# Patient Record
Sex: Male | Born: 1980 | Race: White | Hispanic: No | Smoking: Current every day smoker
Health system: Southern US, Community
[De-identification: ages and names within clinical notes are randomized; demographics above are authoritative.]

## PROBLEM LIST (undated history)

## (undated) DIAGNOSIS — F1910 Other psychoactive substance abuse, uncomplicated: Secondary | ICD-10-CM

## (undated) DIAGNOSIS — J9383 Other pneumothorax: Secondary | ICD-10-CM

## (undated) HISTORY — PX: CHEST TUBE INSERTION: SHX231

---

## 2006-07-29 ENCOUNTER — Emergency Department: Payer: Self-pay | Admitting: Emergency Medicine

## 2008-02-10 ENCOUNTER — Inpatient Hospital Stay: Payer: Self-pay | Admitting: Vascular Surgery

## 2010-01-14 ENCOUNTER — Emergency Department: Payer: Self-pay | Admitting: Unknown Physician Specialty

## 2014-11-27 ENCOUNTER — Emergency Department
Admission: EM | Admit: 2014-11-27 | Discharge: 2014-11-27 | Disposition: A | Discharge status: Home / Self Care | Payer: Self-pay | Attending: Emergency Medicine | Admitting: Emergency Medicine

## 2014-11-27 ENCOUNTER — Encounter: Payer: Self-pay | Admitting: *Deleted

## 2014-11-27 DIAGNOSIS — L02214 Cutaneous abscess of groin: Secondary | ICD-10-CM | POA: Insufficient documentation

## 2014-11-27 DIAGNOSIS — Z720 Tobacco use: Secondary | ICD-10-CM | POA: Insufficient documentation

## 2014-11-27 DIAGNOSIS — L0390 Cellulitis, unspecified: Secondary | ICD-10-CM | POA: Insufficient documentation

## 2014-11-27 MED ORDER — HYDROMORPHONE HCL 1 MG/ML IJ SOLN
1.0000 mg | Freq: Once | INTRAMUSCULAR | Status: AC
  Administered 2014-11-27: 1 mg via INTRAMUSCULAR

## 2014-11-27 MED ORDER — LIDOCAINE-EPINEPHRINE (PF) 1 %-1:200000 IJ SOLN
INTRAMUSCULAR | Status: AC
  Administered 2014-11-27: 30 mL
  Filled 2014-11-27: qty 30

## 2014-11-27 MED ORDER — SULFAMETHOXAZOLE-TRIMETHOPRIM 800-160 MG PO TABS: 1.0000 | ORAL_TABLET | Freq: Two times a day (BID) | ORAL | Status: DC

## 2014-11-27 MED ORDER — HYDROMORPHONE HCL 1 MG/ML IJ SOLN
INTRAMUSCULAR | Status: AC
  Administered 2014-11-27: 1 mg via INTRAMUSCULAR
  Filled 2014-11-27: qty 1

## 2014-11-27 MED ORDER — OXYCODONE-ACETAMINOPHEN 5-325 MG PO TABS: 1.0000 | ORAL_TABLET | Freq: Four times a day (QID) | ORAL | Status: DC | PRN

## 2014-11-27 MED ORDER — LIDOCAINE-EPINEPHRINE 2 %-1:100000 IJ SOLN: 30.0000 mL | Freq: Once | INTRAMUSCULAR | Status: AC

## 2014-11-27 NOTE — Discharge Instructions (Signed)
Abscess  An abscess is an infected area that contains a collection of pus and debris.It can occur in almost any part of the body. An abscess is also known as a furuncle or boil.  CAUSES   An abscess occurs when tissue gets infected. This can occur from blockage of oil or sweat glands, infection of hair follicles, or a minor injury to the skin. As the body tries to fight the infection, pus collects in the area and creates pressure under the skin. This pressure causes pain. People with weakened immune systems have difficulty fighting infections and get certain abscesses more often.   SYMPTOMS  Usually an abscess develops on the skin and becomes a painful mass that is red, warm, and tender. If the abscess forms under the skin, you may feel a moveable soft area under the skin. Some abscesses break open (rupture) on their own, but most will continue to get worse without care. The infection can spread deeper into the body and eventually into the bloodstream, causing you to feel ill.   DIAGNOSIS   Your caregiver will take your medical history and perform a physical exam. A sample of fluid may also be taken from the abscess to determine what is causing your infection.  TREATMENT   Your caregiver may prescribe antibiotic medicines to fight the infection. However, taking antibiotics alone usually does not cure an abscess. Your caregiver may need to make a small cut (incision) in the abscess to drain the pus. In some cases, gauze is packed into the abscess to reduce pain and to continue draining the area.  HOME CARE INSTRUCTIONS    Only take over-the-counter or prescription medicines for pain, discomfort, or fever as directed by your caregiver.   If you were prescribed antibiotics, take them as directed. Finish them even if you start to feel better.   If gauze is used, follow your caregiver's directions for changing the gauze.   To avoid spreading the infection:   Keep your draining abscess covered with a  bandage.   Wash your hands well.   Do not share personal care items, towels, or whirlpools with others.   Avoid skin contact with others.   Keep your skin and clothes clean around the abscess.   Keep all follow-up appointments as directed by your caregiver.  SEEK MEDICAL CARE IF:    You have increased pain, swelling, redness, fluid drainage, or bleeding.   You have muscle aches, chills, or a general ill feeling.   You have a fever.  MAKE SURE YOU:    Understand these instructions.   Will watch your condition.   Will get help right away if you are not doing well or get worse.  Document Released: 12/23/2004 Document Revised: 09/14/2011 Document Reviewed: 05/28/2011  [employer] Patient Information 2015 [employer]. This information is not intended to replace advice given to you by your health care provider. Make sure you discuss any questions you have with your health care provider.  Incision and Drainage  Incision and drainage is a procedure in which a sac-like structure (cystic structure) is opened and drained. The area to be drained usually contains material such as pus, fluid, or blood.   LET YOUR CAREGIVER KNOW ABOUT:    Allergies to medicine.   Medicines taken, including vitamins, herbs, eyedrops, over-the-counter medicines, and creams.   Use of steroids (by mouth or creams).   Previous problems with anesthetics or numbing medicines.   History of bleeding problems or blood clots.     Previous surgery.   Other health problems, including diabetes and kidney problems.   Possibility of pregnancy, if this applies.  RISKS AND COMPLICATIONS   Pain.   Bleeding.   Scarring.   Infection.  BEFORE THE PROCEDURE   You may need to have an ultrasound or other imaging tests to see how large or deep your cystic structure is. Blood tests may also be used to determine if you have an infection or how severe the infection is. You may need to have a tetanus shot.  PROCEDURE   The affected area is cleaned with a  cleaning fluid. The cyst area will then be numbed with a medicine (local anesthetic). A small incision will be made in the cystic structure. A syringe or catheter may be used to drain the contents of the cystic structure, or the contents may be squeezed out. The area will then be flushed with a cleansing solution. After cleansing the area, it is often gently packed with a gauze or another wound dressing. Once it is packed, it will be covered with gauze and tape or some other type of wound dressing.  AFTER THE PROCEDURE    Often, you will be allowed to go home right after the procedure.   You may be given antibiotic medicine to prevent or heal an infection.   If the area was packed with gauze or some other wound dressing, you will likely need to come back in 1 to 2 days to get it removed.   The area should heal in about 14 days.  Document Released: 09/08/2000 Document Revised: 09/14/2011 Document Reviewed: 05/10/2011  [employer] Patient Information 2015 [employer]. This information is not intended to replace advice given to you by your health care provider. Make sure you discuss any questions you have with your health care provider.

## 2014-11-27 NOTE — ED Provider Notes (Signed)
[facility] Emergency Department Provider Note  ____________________________________________  Time seen: 8:00 PM  I have reviewed the triage vital signs and the nursing notes.   HISTORY  Chief Complaint Abscess    HPI Markevius Trombetta is a 34 y.o. male who complains of painful swollen area to the right groin for 3 days. He was started on amoxicillin yesterday without improvement. No fevers or chills abdominal pain vomiting diarrhea. No other complaints. No genital lesions or penile discharge. No dysuria.     No past medical history on file. Negative  There are no active problems to display for this patient.    No past surgical history on file. History of chest tube due to pneumothorax  Current Outpatient Rx  Name  Route  Sig  Dispense  Refill  . oxyCODONE-acetaminophen (ROXICET) 5-325 MG per tablet   Oral   Take 1 tablet by mouth every 6 (six) hours as needed for severe pain.   12 tablet   0   . sulfamethoxazole-trimethoprim (BACTRIM DS) 800-160 MG per tablet   Oral   Take 1 tablet by mouth 2 (two) times daily.   14 tablet   0      Allergies Review of patient's allergies indicates no known allergies.   No family history on file.  Social History Social History  Substance Use Topics  . Smoking status: Current Every Day Smoker  . Smokeless tobacco: None  . Alcohol Use: Yes    Review of Systems  Constitutional:   No fever or chills. No weight changes Eyes:   No blurry vision or double vision.  ENT:   No sore throat. Cardiovascular:   No chest pain. Respiratory:   No dyspnea or cough. Gastrointestinal:   Negative for abdominal pain, vomiting and diarrhea.  No BRBPR or melena. Genitourinary:   Negative for dysuria, urinary retention, bloody urine, or difficulty urinating. Musculoskeletal:   Negative for back pain. No joint swelling or pain. Skin:   Negative for rash. Neurological:   Negative for headaches, focal weakness or  numbness. Psychiatric:  No anxiety or depression.   Endocrine:  No hot/cold intolerance, changes in energy, or sleep difficulty.  10-point ROS otherwise negative.  ____________________________________________   PHYSICAL EXAM:  VITAL SIGNS: ED Triage Vitals  Enc Vitals Group     BP 11/27/14 1926 140/81 mmHg     Pulse Rate 11/27/14 1926 99     Resp 11/27/14 1926 18     Temp 11/27/14 1926 98.6 F (37 C)     Temp Source 11/27/14 1926 Oral     SpO2 11/27/14 1926 99 %     Weight 11/27/14 1926 150 lb (68.04 kg)     Height 11/27/14 1926  (1.778 m)     Head Cir --      Peak Flow --      Pain Score 11/27/14 1927 10     Pain Loc --      Pain Edu? --      Excl. in GC? --      Constitutional:   Alert and oriented. Well appearing and in no distress. Eyes:   No scleral icterus. No conjunctival pallor. PERRL. EOMI ENT   Head:   Normocephalic and atraumatic.   Nose:   No congestion/rhinnorhea. No septal hematoma   Mouth/Throat:   MMM, no pharyngeal erythema. No peritonsillar mass. No uvula shift.   Neck:   No stridor. No SubQ emphysema. No meningismus. Hematological/Lymphatic/Immunilogical:   No cervical lymphadenopathy. Cardiovascular:  RRR. Normal and symmetric distal pulses are present in all extremities. No murmurs, rubs, or gallops. Respiratory:   Normal respiratory effort without tachypnea nor retractions. Breath sounds are clear and equal bilaterally. No wheezes/rales/rhonchi. Gastrointestinal:   Soft and nontender. No distention. There is no CVA tenderness.  No rebound, rigidity, or guarding. There is a large area in the lateral right inguinal area with about a 4-5 cm fluctuant raised area with surrounding erythema and warmth and tenderness. The abscess is not in the region of the femoral neurovascular bundle. Genitourinary:   Normal external genitalia. Musculoskeletal:   Nontender with normal range of motion in all extremities. No joint effusions.  No lower  extremity tenderness.  No edema. Neurologic:   Normal speech and language.  CN 2-10 normal. Motor grossly intact. No pronator drift.  Normal gait. No gross focal neurologic deficits are appreciated.  Skin:    Skin is warm, dry and intact. No rash noted.  No petechiae, purpura, or bullae. Psychiatric:   Mood and affect are normal. Speech and behavior are normal. Patient exhibits appropriate insight and judgment.  ____________________________________________    LABS (pertinent positives/negatives) (all labs ordered are listed, but only abnormal results are displayed) Labs Reviewed - No data to display ____________________________________________   EKG    ____________________________________________    RADIOLOGY    ____________________________________________   PROCEDURES INCISION AND DRAINAGE Performed by: Sharman Cheek Consent: Verbal consent obtained. Risks and benefits: risks, benefits and alternatives were discussed Type: abscess  Body area: Right hip/inguinal area  Anesthesia: local infiltration  Incision was made with a #11 scalpel.  Local anesthetic: lidocaine 2% with epinephrine  Anesthetic total: 2 ml  Complexity: complex Blunt dissection to break up loculations  Drainage: purulent  Drainage amount: 10  Packing material: 1/4 in iodoform gauze  Patient tolerance: Patient tolerated the procedure well with no immediate complications. no neuropathic symptoms or excessive bleeding.      ____________________________________________   INITIAL IMPRESSION / ASSESSMENT AND PLAN / ED COURSE  Pertinent labs & imaging results that were available during my care of the patient were reviewed by me and considered in my medical decision making (see chart for details).  Patient presents with abscess and cellulitis of the right inguinal region. Incision and drainage performed at the bedside. Copious purulent discharge obtained. We'll start the patient on  Bactrim for MRSA coverage and have him follow up for wound recheck in 2 days.     ____________________________________________   FINAL CLINICAL IMPRESSION(S) / ED DIAGNOSES  Final diagnoses:  Inguinal abscess   cellulitis    Sharman Cheek, MD 11/27/14 2050

## 2014-11-27 NOTE — ED Notes (Addendum)
Pt has abscess to right groin.  Sx for 3 days.  Started on amoxil yesterday.  Area is red and painful.  No drainage.

## 2014-11-29 ENCOUNTER — Emergency Department
Admission: EM | Admit: 2014-11-29 | Discharge: 2014-11-29 | Disposition: A | Discharge status: Home / Self Care | Payer: Self-pay | Attending: Emergency Medicine | Admitting: Emergency Medicine

## 2014-11-29 ENCOUNTER — Encounter: Payer: Self-pay | Admitting: Emergency Medicine

## 2014-11-29 DIAGNOSIS — Z792 Long term (current) use of antibiotics: Secondary | ICD-10-CM | POA: Insufficient documentation

## 2014-11-29 DIAGNOSIS — Z4801 Encounter for change or removal of surgical wound dressing: Secondary | ICD-10-CM | POA: Insufficient documentation

## 2014-11-29 DIAGNOSIS — Z5189 Encounter for other specified aftercare: Secondary | ICD-10-CM

## 2014-11-29 DIAGNOSIS — Z720 Tobacco use: Secondary | ICD-10-CM | POA: Insufficient documentation

## 2014-11-29 HISTORY — DX: Other pneumothorax: J93.83

## 2014-11-29 MED ORDER — OXYCODONE-ACETAMINOPHEN 5-325 MG PO TABS
1.0000 | ORAL_TABLET | Freq: Once | ORAL | Status: AC
  Administered 2014-11-29: 1 via ORAL
  Filled 2014-11-29: qty 1

## 2014-11-29 NOTE — Discharge Instructions (Signed)
° °  YOU MAY REMOVE THE PIECE OF PACKING THAT WAS PLACED TONIGHT IN 2 DAYS CONTINUE TAKING YOUR ANTIBIOTICS FOLLOW UP WITH DR. Vear Clock IF ANY CONTINUED PROBLEMS

## 2014-11-29 NOTE — ED Notes (Signed)
Pt presents to ED via POV with c/o of wound check from previous abscess drainage x2 days ago. Pt states he had his abscess drained x2 days ago and is back for a dis-impaction of packing dressing. Pt states pain is localized to affected right hip side. Pt denies abnormal drainage to site. Pt is alert and oriented x4.

## 2014-11-29 NOTE — ED Provider Notes (Signed)
[facility] Emergency Department Provider Note    ____________________________________________  Time seen: Approximately 7:44 PM  I have reviewed the triage vital signs and the nursing notes.   HISTORY  Chief Complaint Wound Check   HPI Leon Riley is a 34 y.o. male is here today for packing removal of an abscess that was I&D 2 days ago. He states he continues to take his antibiotic's without any difficulty. He denies any fever. He states last time he took any pain medication was earlier this morning. Currently he rates his pain 8 out of 10.   Past Medical History  Diagnosis Date  . Spontaneous pneumothorax     There are no active problems to display for this patient.   History reviewed. No pertinent past surgical history.  Current Outpatient Rx  Name  Route  Sig  Dispense  Refill  . oxyCODONE-acetaminophen (ROXICET) 5-325 MG per tablet   Oral   Take 1 tablet by mouth every 6 (six) hours as needed for severe pain.   12 tablet   0   . sulfamethoxazole-trimethoprim (BACTRIM DS) 800-160 MG per tablet   Oral   Take 1 tablet by mouth 2 (two) times daily.   14 tablet   0     Allergies Review of patient's allergies indicates no known allergies.  No family history on file.  Social History Social History  Substance Use Topics  . Smoking status: Current Every Day Smoker  . Smokeless tobacco: None  . Alcohol Use: Yes    Review of Systems Constitutional: No fever/chills Cardiovascular: Denies chest pain. Respiratory: Denies shortness of breath. Gastrointestinal: No abdominal pain.  No nausea, no vomiting. Musculoskeletal: Negative for back pain. Skin: Negative for rash. Positive for abscess Neurological: Negative for headaches, focal weakness or numbness.  10-point ROS otherwise negative.  ____________________________________________   PHYSICAL EXAM:  VITAL SIGNS: ED Triage Vitals  Enc Vitals Group     BP 11/29/14 1928 126/81  mmHg     Pulse Rate 11/29/14 1928 83     Resp --      Temp 11/29/14 1928 98.2 F (36.8 C)     Temp Source 11/29/14 1928 Oral     SpO2 11/29/14 1928 100 %     Weight 11/29/14 1928 150 lb (68.04 kg)     Height 11/29/14 1928  (1.778 m)     Head Cir --      Peak Flow --      Pain Score 11/29/14 1935 8     Pain Loc --      Pain Edu? --      Excl. in GC? --     Constitutional: Alert and oriented. Well appearing and in no acute distress. Eyes: Conjunctivae are normal. PERRL. EOMI. Head: Atraumatic. Nose: No congestion/rhinnorhea. Neck: No stridor.   Cardiovascular: Normal rate, regular rhythm. Grossly normal heart sounds.  Good peripheral circulation. Respiratory: Normal respiratory effort.  No retractions. Lungs CTAB. Gastrointestinal: Soft and nontender. No distention Musculoskeletal: No lower extremity tenderness nor edema.  No joint effusions. Neurologic:  Normal speech and language. No gross focal neurologic deficits are appreciated. No gait instability. Skin:  Skin is warm, dry. Wound site appears to be healing without any signs of extending cellulitis. Psychiatric: Mood and affect are normal. Speech and behavior are normal.  ____________________________________________   LABS (all labs ordered are listed, but only abnormal results are displayed)  Labs Reviewed - No data to display  PROCEDURES  Procedure(s) performed: Packing removal  without any difficulty. Area was packed with iodoform gauze approximately 3 cm.  Critical Care performed: No  ____________________________________________   INITIAL IMPRESSION / ASSESSMENT AND PLAN / ED COURSE  Pertinent labs & imaging results that were available during my care of the patient were reviewed by me and considered in my medical decision making (see chart for details).  Patient is continue taking antibiotic's as prescribed. He is told that he may remove the packing himself in 2 days. He is return to the emergency room  if any severe worsening or urgent concerns. ____________________________________________   FINAL CLINICAL IMPRESSION(S) / ED DIAGNOSES  Final diagnoses:  Wound check, abscess      Tommi Rumps, PA-C 11/29/14 2001  Loleta Rose, MD 11/29/14 2226

## 2014-11-29 NOTE — ED Notes (Signed)
Patient with no complaints at this time. Respirations even and unlabored. Skin warm/dry. Discharge instructions reviewed with patient at this time. Patient given opportunity to voice concerns/ask questions. Patient discharged at this time and left Emergency Department with steady gait.   

## 2015-05-03 ENCOUNTER — Encounter (HOSPITAL_COMMUNITY): Payer: Self-pay

## 2015-05-03 ENCOUNTER — Emergency Department (HOSPITAL_COMMUNITY)
Admission: EM | Admit: 2015-05-03 | Discharge: 2015-05-03 | Discharge status: Against Medical Advice | Payer: Self-pay | Attending: Emergency Medicine | Admitting: Emergency Medicine

## 2015-05-03 DIAGNOSIS — Z8709 Personal history of other diseases of the respiratory system: Secondary | ICD-10-CM | POA: Insufficient documentation

## 2015-05-03 DIAGNOSIS — X58XXXA Exposure to other specified factors, initial encounter: Secondary | ICD-10-CM | POA: Insufficient documentation

## 2015-05-03 DIAGNOSIS — Z792 Long term (current) use of antibiotics: Secondary | ICD-10-CM | POA: Insufficient documentation

## 2015-05-03 DIAGNOSIS — R000 Tachycardia, unspecified: Secondary | ICD-10-CM | POA: Insufficient documentation

## 2015-05-03 DIAGNOSIS — Y998 Other external cause status: Secondary | ICD-10-CM | POA: Insufficient documentation

## 2015-05-03 DIAGNOSIS — Y9389 Activity, other specified: Secondary | ICD-10-CM | POA: Insufficient documentation

## 2015-05-03 DIAGNOSIS — F17200 Nicotine dependence, unspecified, uncomplicated: Secondary | ICD-10-CM | POA: Insufficient documentation

## 2015-05-03 DIAGNOSIS — Y9289 Other specified places as the place of occurrence of the external cause: Secondary | ICD-10-CM | POA: Insufficient documentation

## 2015-05-03 DIAGNOSIS — T401X1A Poisoning by heroin, accidental (unintentional), initial encounter: Secondary | ICD-10-CM | POA: Insufficient documentation

## 2015-05-03 HISTORY — DX: Other psychoactive substance abuse, uncomplicated: F19.10

## 2015-05-03 MED ORDER — NALOXONE HCL 0.4 MG/0.4ML IJ SOAJ: 0.4000 mg | Freq: Once | INTRAMUSCULAR | Status: AC | PRN

## 2015-05-03 NOTE — ED Notes (Signed)
Pt left with all his belongings and ambulated out of the treatment area.  

## 2015-05-03 NOTE — ED Notes (Signed)
PER EMS: pt was with friends, used IV heroin and overdosed. Pt was unresponsive with assisted ventilations by the fire dept. Narcan given by [employer] and pt became more responsive and started breathing on his own. Pt now A&Ox4. BP-170/90, HR-129. Pt also admits to using cocaine tonight as well.

## 2015-05-03 NOTE — ED Provider Notes (Signed)
CSN: [id number]     Arrival date & time 05/03/15  0123 History  By signing my name below, I, Emmanuella Mensah, attest that this documentation has been prepared under the direction and in the presence of Tomasita Crumble, MD. Electronically Signed: Angelene Giovanni, ED Scribe. 05/03/2015. 1:50 AM.    Chief Complaint  Patient presents with  . Drug Overdose   The history is provided by the patient. No language interpreter was used.   HPI Comments: Leon Riley is a 35 y.o. male with a hx of IV drug abuse who presents to the Emergency Department via [employer] s/p drug overdose of IV heroin that occurred PTA. Pt reports associated chills and tremors. Per [employer], pt's friends called 911 after pt became unresponsive while using the heroin and his friends were performing CPR upon arrival. Pt was then given Narcan by [employer] and became responsive being able to breathe on his own. No fever or n/v.    Past Medical History  Diagnosis Date  . Spontaneous pneumothorax   . IV drug abuse    History reviewed. No pertinent past surgical history. No family history on file. Social History  Substance Use Topics  . Smoking status: Current Every Day Smoker  . Smokeless tobacco: None  . Alcohol Use: Yes    Review of Systems  Constitutional: Positive for chills. Negative for fever.  Gastrointestinal: Negative for nausea and vomiting.  Musculoskeletal:       Tremors  All other systems reviewed and are negative.  A complete 10 system review of systems was obtained and all systems are negative except as noted in the HPI and PMH.    Allergies  Review of patient's allergies indicates no known allergies.  Home Medications   Prior to Admission medications   Medication Sig Start Date End Date Taking? Authorizing Provider  oxyCODONE-acetaminophen (ROXICET) 5-325 MG per tablet Take 1 tablet by mouth every 6 (six) hours as needed for severe pain. 11/27/14   Sharman Cheek, MD  sulfamethoxazole-trimethoprim (BACTRIM DS)  800-160 MG per tablet Take 1 tablet by mouth 2 (two) times daily. 11/27/14   Sharman Cheek, MD   BP 153/87 mmHg  Pulse 111  Temp(Src) 97.9 F (36.6 C) (Oral)  Resp 13  SpO2 96% Physical Exam  Constitutional: He is oriented to person, place, and time. Vital signs are normal. He appears well-developed and well-nourished.  Non-toxic appearance. He does not appear ill. No distress.  Tremorous    HENT:  Head: Normocephalic and atraumatic.  Nose: Nose normal.  Mouth/Throat: Oropharynx is clear and moist. No oropharyngeal exudate.  Eyes: Conjunctivae and EOM are normal. Pupils are equal, round, and reactive to light. No scleral icterus.  Neck: Normal range of motion. Neck supple. No tracheal deviation, no edema, no erythema and normal range of motion present. No thyroid mass and no thyromegaly present.  Inject site noted to bilateral EJ veins  Cardiovascular: Normal rate, regular rhythm, S1 normal, S2 normal, normal heart sounds, intact distal pulses and normal pulses.  Exam reveals no gallop and no friction rub.   No murmur heard. Tachycardiac   Pulmonary/Chest: Effort normal and breath sounds normal. No respiratory distress. He has no wheezes. He has no rhonchi. He has no rales.  Abdominal: Soft. Normal appearance and bowel sounds are normal. He exhibits no distension, no ascites and no mass. There is no hepatosplenomegaly. There is no tenderness. There is no rebound, no guarding and no CVA tenderness.  Musculoskeletal: Normal range of motion. He exhibits no edema  or tenderness.  Lymphadenopathy:    He has no cervical adenopathy.  Neurological: He is alert and oriented to person, place, and time. He has normal strength. No cranial nerve deficit or sensory deficit.  Skin: Skin is warm, dry and intact. No petechiae and no rash noted. He is not diaphoretic. No erythema. No pallor.  Psychiatric: He has a normal mood and affect. His behavior is normal. Judgment normal.  Nursing note and vitals  reviewed.   ED Course  Procedures (including critical care time) DIAGNOSTIC STUDIES: Oxygen Saturation is 96% on RA, normal by my interpretation.    COORDINATION OF CARE: 1:46 AM- Pt advised of plan for treatment and pt agrees.     MDM   Final diagnoses:  None   Patient presents to the ED for heroin overdose.  He remains tachycardic in the ED, likely due to cocaine use.  He eloped from the ED after police were done giving him the citation without any DC paperwork or work up.   I personally performed the services described in this documentation, which was scribed in my presence. The recorded information has been reviewed and is accurate.     Tomasita Crumble, MD 05/03/15 [phone]

## 2015-05-03 NOTE — Discharge Instructions (Signed)
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  Somebody should use narcan on you if this happens again. Please see a primary care doctor within 3 days for close follow up and to help you quit using drugs.  If any symptoms worsen, come back to the ED immediately.  Thank you. Drug overdose happens when you take too much of a drug. An overdose can occur with illegal drugs, prescription drugs, or over-the-counter (OTC) drugs. The effects of drug overdose can be mild, dangerous, or even deadly. CAUSES Drug overdose may be caused by:  Taking too much of a drug on purpose.  Taking too much of a drug by accident.  An error made by a health care provider who prescribes a drug.  An error made by a pharmacist who fills the prescription order. Drugs that commonly cause overdose include:  Mental health drugs.  Pain medicines.  Illegal drugs.  OTC cough and cold medicines.  Heart medicines.  Seizure medicines. RISK FACTORS Drug overdose is more likely in:  Children. They may be attracted to colorful pills. Because of children's small size, even a small amount of a drug can be dangerous.  Elderly people. They may be taking many different drugs. Elderly people may have difficulty reading labels or remembering when they last took their medicine. The risk of drug overdose is also higher for someone who:  Takes illegal drugs.  Takes a drug and drinks alcohol.  Has a mental health condition. SYMPTOMS Signs and symptoms of drug overdose depend on the drug and the amount that was taken. Common danger signs include:  Behavior changes.  Sleepiness.  Slowed breathing.  Nausea and vomiting.  Seizures.  Changes in eye pupil size (very large or very small). If there are signs of very low blood pressure from a drug overdose (shock), emergency treatment is required. These signs include:  Cold and clammy skin.  Pale skin.  Blue lips.  Very slow breathing.  Extreme  sleepiness.  Loss of consciousness. DIAGNOSIS Drug overdose may be diagnosed based on your symptoms. It is important that you tell your health care provider:  All of the drugs that you have taken.  When you took the drugs.  Whether you were drinking alcohol. Your health care provider will do a physical exam. This exam may include:  Checking and monitoring your heart rate and rhythm, your temperature, and your blood pressure (vital signs).  Checking your breathing and oxygen level. You may also have tests, including:   Urine tests to check for drugs in your system.  Blood tests to check for:  Drugs in your system.  Signs of an imbalance of your blood minerals (electrolytes).  Liver damage.  Kidney damage. TREATMENT Supporting your vital signs and your breathing is the first step in treating a drug overdose. Treatment may also include:  Receiving fluids and electrolytes through an IV tube.  Having a breathing tube (endotracheal tube) inserted in your airway to help you breathe.  Having a tube passed through your nose and into your stomach (nasogastric tube) to wash out your stomach.  Medicines. You may get medicines to:  Make you vomit.  Absorb any medicine that is left in your digestive system (activated charcoal).  Block or reverse the effect of the drug that caused the overdose.  Having your blood filtered through an artificial kidney machine (hemodialysis). You may need this if your overdose is severe or if you have kidney failure.  Having ongoing counseling and mental health support  if you intentionally overdosed or used an illegal drug. HOME CARE INSTRUCTIONS  Take medicines only as directed by your health care provider. Always ask your health care provider to discuss the possible side effects of any new drug that you start taking.  Keep a list of all of the drugs that you take, including over-the-counter medicines. Bring this list with you to all of your  medical visits.  Read the drug inserts that come with your medicines.  Do not use illegal drugs.  Do not drink alcohol when taking drugs.  Store all medicines in safety containers that are out of the reach of children.  Keep the phone number of your local poison control center near your phone or on your cell phone.  Get help if you are struggling with alcohol or drug use.  Get help if you are struggling with depression or another mental health problem.  Keep all follow-up visits as directed by your health care provider. This is important. SEEK MEDICAL CARE IF:  Your symptoms return.  You develop any new signs or symptoms when you are taking medicines. SEEK IMMEDIATE MEDICAL CARE IF:  You think that you or someone else may have taken too much of a drug. The hotline of the [facility] is 2256482569.  You or someone else is having symptoms of a drug overdose.  You have serious thoughts about hurting yourself or others.  You have chest pain.  You have difficulty breathing.  You have a loss of consciousness. Drug overdose is an emergency. Do not wait to see if the symptoms will go away. Get medical help right away. Call your local emergency services (911 in the [place] Do not drive yourself to the hospital.   This information is not intended to replace advice given to you by your health care provider. Make sure you discuss any questions you have with your health care provider.   Document Released: 07/30/2014 Document Reviewed: 07/30/2014 Elsevier Interactive Patient Education [employer]

## 2015-05-15 ENCOUNTER — Encounter: Payer: Self-pay | Admitting: Medical Oncology

## 2015-05-15 ENCOUNTER — Emergency Department
Admission: EM | Admit: 2015-05-15 | Discharge: 2015-05-15 | Disposition: A | Discharge status: Home / Self Care | Payer: Self-pay | Attending: Emergency Medicine | Admitting: Emergency Medicine

## 2015-05-15 DIAGNOSIS — Z0289 Encounter for other administrative examinations: Secondary | ICD-10-CM | POA: Insufficient documentation

## 2015-05-15 DIAGNOSIS — F17200 Nicotine dependence, unspecified, uncomplicated: Secondary | ICD-10-CM | POA: Insufficient documentation

## 2015-05-15 DIAGNOSIS — Z139 Encounter for screening, unspecified: Secondary | ICD-10-CM

## 2015-05-15 DIAGNOSIS — Z792 Long term (current) use of antibiotics: Secondary | ICD-10-CM | POA: Insufficient documentation

## 2015-05-15 NOTE — ED Notes (Signed)
See triage.. Needs work note ..states he had some n/v/d but sx's have subsided   NAD noted at present

## 2015-05-15 NOTE — ED Notes (Signed)
Pt reports n,v,d x 2 days. Issues have subsided but pt called out of work and needs note. Denies sx's at this time.

## 2015-05-15 NOTE — Discharge Instructions (Signed)
Medical Screening Exam °A medical screening exam has been done. This exam helps find the cause of your problem and determines whether you need emergency treatment. Your exam has shown that you do not need emergency treatment at this point. It is safe for you to go to your caregiver's office or clinic for treatment. You should make an appointment today to see your caregiver as soon as he or she is available. °Depending on your illness, your symptoms and condition can change over time. If your condition gets worse or you develop new or troubling symptoms before you see your caregiver, you should return to the emergency department for further evaluation.  °  °This information is not intended to replace advice given to you by your health care provider. Make sure you discuss any questions you have with your health care provider. °  °Document Released: 04/22/2004 Document Revised: 04/05/2014 Document Reviewed: 12/02/2010 °Elsevier Interactive Patient Education ©2016 Else[employer] Inc. ° °

## 2015-05-15 NOTE — ED Provider Notes (Signed)
[facility] Emergency Department Provider Note ____________________________________________  Time seen: Approximately 3:47 PM  I have reviewed the triage vital signs and the nursing notes.   HISTORY  Chief Complaint Letter for School/Work and Diarrhea  HPI Leon Riley is a 35 y.o. male who presents to the emergency department for a work note due to nausea, vomiting, and diarrhea that has now resolved. He is now able to tolerate solids and fluids without vomiting. He denies abdominal pain or other complaints.   Past Medical History  Diagnosis Date  . Spontaneous pneumothorax   . IV drug abuse     There are no active problems to display for this patient.   History reviewed. No pertinent past surgical history.  Current Outpatient Rx  Name  Route  Sig  Dispense  Refill  . Naloxone HCl 0.4 MG/0.4ML SOAJ   Injection   Inject 0.4 mg as directed once as needed (heroin overdose). Inject 1 mL intramuscularly upon signs of opioid overdose. May repeat X 1. Call 911.   2 Package   0   . oxyCODONE-acetaminophen (ROXICET) 5-325 MG per tablet   Oral   Take 1 tablet by mouth every 6 (six) hours as needed for severe pain.   12 tablet   0   . sulfamethoxazole-trimethoprim (BACTRIM DS) 800-160 MG per tablet   Oral   Take 1 tablet by mouth 2 (two) times daily.   14 tablet   0     Allergies Review of patient's allergies indicates no known allergies.  No family history on file.  Social History Social History  Substance Use Topics  . Smoking status: Current Every Day Smoker  . Smokeless tobacco: None  . Alcohol Use: Yes    Review of Systems Constitutional: No fever/chills ENT: No sore throat. Respiratory: Denies shortness of breath. Gastrointestinal: No abdominal pain.  No nausea, no vomiting.  No diarrhea.  No constipation. Skin: Negative for rash. ____________________________________________   PHYSICAL EXAM:  VITAL SIGNS: ED Triage Vitals   Enc Vitals Group     BP 05/15/15 1437 119/65 mmHg     Pulse Rate 05/15/15 1437 77     Resp 05/15/15 1437 20     Temp 05/15/15 1437 98 F (36.7 C)     Temp Source 05/15/15 1437 Oral     SpO2 05/15/15 1437 96 %     Weight 05/15/15 1437 150 lb (68.04 kg)     Height 05/15/15 1437  (1.778 m)     Head Cir --      Peak Flow --      Pain Score 05/15/15 1446 0     Pain Loc --      Pain Edu? --      Excl. in GC? --    Constitutional: Alert and oriented. Well appearing and in no acute distress. Eyes: Conjunctivae are normal. Head: Atraumatic. Mouth/Throat: Mucous membranes are moist. Neck: No stridor.  Cardiovascular: Normal rate, regular rhythm. Respiratory: Normal respiratory effort. Gastrointestinal: Soft and nontender. No distention. No abdominal bruits. Musculoskeletal: No lower extremity tenderness nor edema.  No joint effusions. Neurologic:  Normal speech and language. No gross focal neurologic deficits are appreciated. No gait instability. ___________________________________________   LABS (all labs ordered are listed, but only abnormal results are displayed)  Labs Reviewed - No data to display ____________________________________________  EKG   ____________________________________________  RADIOLOGY   ____________________________________________   PROCEDURES  Procedure(s) performed: None  Critical Care performed: No  ____________________________________________   INITIAL  IMPRESSION / ASSESSMENT AND PLAN / ED COURSE  Pertinent labs & imaging results that were available during my care of the patient were reviewed by me and considered in my medical decision making (see chart for details).  Patient was given work excuse and advised to follow up with his PCP or return to the ER for symptoms that change or worsen or for new concerns. ____________________________________________   FINAL CLINICAL IMPRESSION(S) / ED DIAGNOSES  Final diagnoses:   Encounter for medical screening examination  Encounter to obtain excuse from work      Chinita Pester, FNP 05/15/15 1809  Jeanmarie Plant, MD 05/15/15 2212

## 2015-11-20 ENCOUNTER — Encounter: Payer: Self-pay | Admitting: Emergency Medicine

## 2015-11-20 ENCOUNTER — Emergency Department
Admission: EM | Admit: 2015-11-20 | Discharge: 2015-11-20 | Disposition: A | Discharge status: Home / Self Care | Payer: No Typology Code available for payment source | Attending: Emergency Medicine | Admitting: Emergency Medicine

## 2015-11-20 ENCOUNTER — Emergency Department: Payer: No Typology Code available for payment source

## 2015-11-20 DIAGNOSIS — F17200 Nicotine dependence, unspecified, uncomplicated: Secondary | ICD-10-CM | POA: Insufficient documentation

## 2015-11-20 DIAGNOSIS — S82892A Other fracture of left lower leg, initial encounter for closed fracture: Secondary | ICD-10-CM

## 2015-11-20 DIAGNOSIS — S20412A Abrasion of left back wall of thorax, initial encounter: Secondary | ICD-10-CM | POA: Insufficient documentation

## 2015-11-20 DIAGNOSIS — Y9355 Activity, bike riding: Secondary | ICD-10-CM | POA: Diagnosis not present

## 2015-11-20 DIAGNOSIS — T148XXA Other injury of unspecified body region, initial encounter: Secondary | ICD-10-CM

## 2015-11-20 DIAGNOSIS — S5002XA Contusion of left elbow, initial encounter: Secondary | ICD-10-CM | POA: Insufficient documentation

## 2015-11-20 DIAGNOSIS — Y999 Unspecified external cause status: Secondary | ICD-10-CM | POA: Insufficient documentation

## 2015-11-20 DIAGNOSIS — Y92410 Unspecified street and highway as the place of occurrence of the external cause: Secondary | ICD-10-CM | POA: Insufficient documentation

## 2015-11-20 DIAGNOSIS — S8255XA Nondisplaced fracture of medial malleolus of left tibia, initial encounter for closed fracture: Secondary | ICD-10-CM | POA: Insufficient documentation

## 2015-11-20 DIAGNOSIS — S99912A Unspecified injury of left ankle, initial encounter: Secondary | ICD-10-CM | POA: Diagnosis present

## 2015-11-20 DIAGNOSIS — S8002XA Contusion of left knee, initial encounter: Secondary | ICD-10-CM | POA: Insufficient documentation

## 2015-11-20 MED ORDER — BACITRACIN ZINC 500 UNIT/GM EX OINT
TOPICAL_OINTMENT | CUTANEOUS | Status: AC
  Filled 2015-11-20: qty 1.8

## 2015-11-20 MED ORDER — IBUPROFEN 600 MG PO TABS: 600.0000 mg | ORAL_TABLET | Freq: Three times a day (TID) | ORAL | 0 refills | Status: DC | PRN

## 2015-11-20 MED ORDER — BACITRACIN ZINC 500 UNIT/GM EX OINT: TOPICAL_OINTMENT | Freq: Two times a day (BID) | CUTANEOUS | Status: DC

## 2015-11-20 MED ORDER — OXYCODONE-ACETAMINOPHEN 5-325 MG PO TABS
2.0000 | ORAL_TABLET | Freq: Once | ORAL | Status: AC
  Administered 2015-11-20: 2 via ORAL

## 2015-11-20 MED ORDER — OXYCODONE-ACETAMINOPHEN 5-325 MG PO TABS
ORAL_TABLET | ORAL | Status: AC
  Administered 2015-11-20: 2 via ORAL
  Filled 2015-11-20: qty 2

## 2015-11-20 MED ORDER — OXYCODONE-ACETAMINOPHEN 7.5-325 MG PO TABS: 1.0000 | ORAL_TABLET | ORAL | 0 refills | Status: DC | PRN
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
## 2016-11-03 ENCOUNTER — Encounter: Payer: Self-pay | Admitting: Emergency Medicine

## 2016-11-03 DIAGNOSIS — F17200 Nicotine dependence, unspecified, uncomplicated: Secondary | ICD-10-CM | POA: Insufficient documentation

## 2016-11-03 DIAGNOSIS — L02412 Cutaneous abscess of left axilla: Secondary | ICD-10-CM | POA: Insufficient documentation

## 2016-11-03 NOTE — ED Triage Notes (Signed)
Pt ambulatory to triage with steady gait, no distress noted. Pt c/o pain under left arm, in axillary area. Pt has raised red area without drainage since Saturday. Pt has HX of abscess.

## 2016-11-04 ENCOUNTER — Emergency Department
Admission: EM | Admit: 2016-11-04 | Discharge: 2016-11-04 | Disposition: A | Discharge status: Home / Self Care | Payer: Self-pay | Attending: Student in an Organized Health Care Education/Training Program | Admitting: Student in an Organized Health Care Education/Training Program

## 2016-11-04 DIAGNOSIS — L02412 Cutaneous abscess of left axilla: Secondary | ICD-10-CM

## 2016-11-04 MED ORDER — BUPIVACAINE HCL (PF) 0.5 % IJ SOLN
10.0000 mL | Freq: Once | INTRAMUSCULAR | Status: AC
  Administered 2016-11-04: 10 mL
  Filled 2016-11-04: qty 30

## 2016-11-04 MED ORDER — MORPHINE SULFATE (PF) 4 MG/ML IV SOLN: 8.0000 mg | INTRAVENOUS | Status: DC | PRN

## 2016-11-04 MED ORDER — SULFAMETHOXAZOLE-TRIMETHOPRIM 800-160 MG PO TABS
1.0000 | ORAL_TABLET | Freq: Once | ORAL | Status: AC
  Administered 2016-11-04: 1 via ORAL
  Filled 2016-11-04: qty 1

## 2016-11-04 MED ORDER — MORPHINE SULFATE (PF) 4 MG/ML IV SOLN
8.0000 mg | INTRAVENOUS | Status: DC | PRN
  Administered 2016-11-04: 8 mg via INTRAMUSCULAR
  Filled 2016-11-04: qty 2

## 2016-11-04 MED ORDER — SULFAMETHOXAZOLE-TRIMETHOPRIM 800-160 MG PO TABS: 1.0000 | ORAL_TABLET | Freq: Two times a day (BID) | ORAL | 0 refills | Status: AC

## 2016-11-04 MED ORDER — TRAMADOL HCL 50 MG PO TABS: 50.0000 mg | ORAL_TABLET | Freq: Four times a day (QID) | ORAL | 0 refills | Status: DC | PRN

## 2016-11-04 NOTE — ED Provider Notes (Signed)
[facility] Emergency Department Provider Note    First MD Initiated Contact with Patient 11/04/16 0149     (approximate)  I have reviewed the triage vital signs and the nursing notes.   HISTORY  Chief Complaint Abscess    HPI Leon Riley is a 36 y.o. male Presents with chief complaint of left pain swelling and boil to the left armpit. States that he had a small area of the looked like a pimple for the past several days and then suddenly had significant swelling and tenderness over the past 24 hours. It measured fevers at home. No drainage. Does have a history of IV drug abuse but denies any recent drug use.  Discussed the pain is moderate to severe.   Past Medical History:  Diagnosis Date  . IV drug abuse   . Spontaneous pneumothorax    History reviewed. No pertinent family history. History reviewed. No pertinent surgical history. There are no active problems to display for this patient.     Prior to Admission medications   Medication Sig Start Date End Date Taking? Authorizing Provider  ibuprofen (ADVIL,MOTRIN) 600 MG tablet Take 1 tablet (600 mg total) by mouth every 8 (eight) hours as needed. 11/20/15   Joni Reining, PA-C  Naloxone HCl 0.4 MG/0.4ML SOAJ Inject 0.4 mg as directed once as needed (heroin overdose). Inject 1 mL intramuscularly upon signs of opioid overdose. May repeat X 1. Call 911. 05/03/15   Tomasita Crumble, MD  oxyCODONE-acetaminophen (PERCOCET) 7.5-325 MG tablet Take 1 tablet by mouth every 4 (four) hours as needed for severe pain. 11/20/15   Joni Reining, PA-C  sulfamethoxazole-trimethoprim (BACTRIM DS) 800-160 MG per tablet Take 1 tablet by mouth 2 (two) times daily. 11/27/14   Sharman Cheek, MD  sulfamethoxazole-trimethoprim (BACTRIM DS,SEPTRA DS) 800-160 MG tablet Take 1 tablet by mouth 2 (two) times daily. 11/04/16 11/14/16  Willy Eddy, MD  traMADol (ULTRAM) 50 MG tablet Take 1 tablet (50 mg total) by mouth every 6  (six) hours as needed. 11/04/16 11/04/17  Willy Eddy, MD    Allergies Patient has no known allergies.    Social History Social History  Substance Use Topics  . Smoking status: Current Every Day Smoker  . Smokeless tobacco: Never Used  . Alcohol use Yes    Review of Systems Patient denies headaches, rhinorrhea, blurry vision, numbness, shortness of breath, chest pain, edema, cough, abdominal pain, nausea, vomiting, diarrhea, dysuria, fevers, rashes or hallucinations unless otherwise stated above in HPI. ____________________________________________   PHYSICAL EXAM:  VITAL SIGNS: Vitals:   11/04/16 0220 11/04/16 0307  BP: 128/87 119/81  Pulse: 86 83  Resp: 20 18  Temp:      Constitutional: Alert and oriented. Well appearing and in no acute distress. Eyes: Conjunctivae are normal.  Head: Atraumatic. Nose: No congestion/rhinnorhea. Mouth/Throat: Mucous membranes are moist.   Neck: No stridor. Painless ROM.  Cardiovascular: Normal rate, regular rhythm. Grossly normal heart sounds.  Good peripheral circulation. Respiratory: Normal respiratory effort.  No retractions. Lungs CTAB. Gastrointestinal: Soft and nontender. No distention. No abdominal bruits. No CVA tenderness. Musculoskeletal: No lower extremity tenderness nor edema.  No joint effusions. Neurologic:  Normal speech and language. No gross focal neurologic deficits are appreciated. No facial droop Skin:  Skin is warm, dry and intact. No rash noted.  Left axilla with tender and fluctuant mass 2cm in diameter with small punctate area of erythema posterior and lateral to area of maximum fluctuance Psychiatric: Mood and affect are  normal. Speech and behavior are normal.  ____________________________________________   LABS (all labs ordered are listed, but only abnormal results are displayed)  No results found for this or any previous visit (from the past 24  hour(s)). ____________________________________________ ____________________________________________  RADIOLOGY   ____________________________________________   PROCEDURES  Procedure(s) performed:  Procedures    Critical Care performed: no ____________________________________________   INITIAL IMPRESSION / ASSESSMENT AND PLAN / ED COURSE  Pertinent labs & imaging results that were available during my care of the patient were reviewed by me and considered in my medical decision making (see chart for details).  DDX: abscess, carbuncle, lymphadenitis  Leon Riley is a 36 y.o. who presents to the ED with with 2 cm abscess for last several days. No systemic symptoms, no fevers. VSS. Exam with small amount of surrounding erythema, c/w cellulitis. Clinical picture is not consistent with necrotizing fasc or osteomyelitis.  I & D of abscess done w/o complications. Area explored, drained as above. Will  Start ABX for small area of cellulitis surrounding abscess. Plan f/u for wound recheck in 2 days.  Patient ambulated without distress and tolerated PO. Patient stable for DC home with plan for close PCP follow up.       ____________________________________________   FINAL CLINICAL IMPRESSION(S) / ED DIAGNOSES  Final diagnoses:  Abscess of left axilla      NEW MEDICATIONS STARTED DURING THIS VISIT:  Discharge Medication List as of 11/04/2016  3:03 AM    START taking these medications   Details  !! sulfamethoxazole-trimethoprim (BACTRIM DS,SEPTRA DS) 800-160 MG tablet Take 1 tablet by mouth 2 (two) times daily., Starting Thu 11/04/2016, Until Sun 11/14/2016, Print    traMADol (ULTRAM) 50 MG tablet Take 1 tablet (50 mg total) by mouth every 6 (six) hours as needed., Starting Thu 11/04/2016, Until Fri 11/04/2017, Print     !! - Potential duplicate medications found. Please discuss with provider.       Note:  This document was prepared using Dragon voice recognition software  and may include unintentional dictation errors.    Willy Eddyobinson, Marymargaret Kirker, MD 11/04/16 [phone]

## 2017-01-04 ENCOUNTER — Emergency Department
Admission: EM | Admit: 2017-01-04 | Discharge: 2017-01-04 | Discharge status: Against Medical Advice | Payer: Self-pay | Attending: Student in an Organized Health Care Education/Training Program | Admitting: Student in an Organized Health Care Education/Training Program

## 2017-01-04 ENCOUNTER — Emergency Department: Payer: Self-pay

## 2017-01-04 DIAGNOSIS — L0291 Cutaneous abscess, unspecified: Secondary | ICD-10-CM

## 2017-01-04 DIAGNOSIS — F17200 Nicotine dependence, unspecified, uncomplicated: Secondary | ICD-10-CM | POA: Insufficient documentation

## 2017-01-04 DIAGNOSIS — L03113 Cellulitis of right upper limb: Secondary | ICD-10-CM | POA: Insufficient documentation

## 2017-01-04 DIAGNOSIS — F1910 Other psychoactive substance abuse, uncomplicated: Secondary | ICD-10-CM | POA: Insufficient documentation

## 2017-01-04 DIAGNOSIS — L02511 Cutaneous abscess of right hand: Secondary | ICD-10-CM | POA: Insufficient documentation

## 2017-01-04 LAB — COMPREHENSIVE METABOLIC PANEL
ALK PHOS: 118 U/L (ref 38–126)
ALT: 24 U/L (ref 17–63)
AST: 26 U/L (ref 15–41)
Albumin: 4.5 g/dL (ref 3.5–5.0)
Anion gap: 11 (ref 5–15)
BUN: 12 mg/dL (ref 6–20)
CALCIUM: 10.1 mg/dL (ref 8.9–10.3)
CO2: 24 mmol/L (ref 22–32)
CREATININE: 1.06 mg/dL (ref 0.61–1.24)
Chloride: 103 mmol/L (ref 101–111)
GFR calc non Af Amer: >60 mL/min (ref >60)
GLUCOSE: 99 mg/dL (ref 65–99)
Potassium: 4.1 mmol/L (ref 3.5–5.1)
SODIUM: 138 mmol/L (ref 135–145)
Total Bilirubin: 0.9 mg/dL (ref 0.3–1.2)
Total Protein: 9.4 g/dL — ABNORMAL HIGH (ref 6.5–8.1)

## 2017-01-04 LAB — CBC WITH DIFFERENTIAL/PLATELET
BASOS PCT: 0 %
Basophils Absolute: 0.1 10*3/uL (ref 0–0.1)
EOS ABS: 0.2 10*3/uL (ref 0–0.7)
EOS PCT: 1 %
HCT: 43 % (ref 40.0–52.0)
HEMOGLOBIN: 14.9 g/dL (ref 13.0–18.0)
LYMPHS ABS: 2.3 10*3/uL (ref 1.0–3.6)
Lymphocytes Relative: 15 %
MCH: 29 pg (ref 26.0–34.0)
MCHC: 34.6 g/dL (ref 32.0–36.0)
MCV: 83.9 fL (ref 80.0–100.0)
MONO ABS: 1.1 10*3/uL — AB (ref 0.2–1.0)
MONOS PCT: 7 %
NEUTROS PCT: 77 %
Neutro Abs: 11.4 10*3/uL — ABNORMAL HIGH (ref 1.4–6.5)
Platelets: 209 10*3/uL (ref 150–440)
RBC: 5.13 MIL/uL (ref 4.40–5.90)
RDW: 13.6 % (ref 11.5–14.5)
WBC: 15 10*3/uL — ABNORMAL HIGH (ref 3.8–10.6)

## 2017-01-04 LAB — LACTIC ACID, PLASMA: Lactic Acid, Venous: 0.8 mmol/L (ref 0.5–1.9)

## 2017-01-04 MED ORDER — SULFAMETHOXAZOLE-TRIMETHOPRIM 800-160 MG PO TABS
1.0000 | ORAL_TABLET | Freq: Once | ORAL | Status: AC
  Administered 2017-01-04: 1 via ORAL
  Filled 2017-01-04: qty 1

## 2017-01-04 MED ORDER — MORPHINE SULFATE (PF) 4 MG/ML IV SOLN
4.0000 mg | INTRAVENOUS | Status: DC | PRN
  Administered 2017-01-04 (×2): 4 mg via INTRAVENOUS
  Filled 2017-01-04 (×2): qty 1

## 2017-01-04 MED ORDER — BUPIVACAINE HCL 0.25 % IJ SOLN
30.0000 mL | Freq: Once | INTRAMUSCULAR | Status: DC
  Filled 2017-01-04: qty 30

## 2017-01-04 MED ORDER — VANCOMYCIN HCL IN DEXTROSE 1-5 GM/200ML-% IV SOLN
1000.0000 mg | Freq: Once | INTRAVENOUS | Status: AC
  Administered 2017-01-04: 1000 mg via INTRAVENOUS
  Filled 2017-01-04: qty 200

## 2017-01-04 MED ORDER — SODIUM CHLORIDE 0.9 % IV BOLUS (SEPSIS)
1000.0000 mL | Freq: Once | INTRAVENOUS | Status: AC
  Administered 2017-01-04: 1000 mL via INTRAVENOUS

## 2017-01-04 MED ORDER — BUPIVACAINE HCL (PF) 0.5 % IJ SOLN
INTRAMUSCULAR | Status: AC
  Administered 2017-01-04: 150 mg
  Filled 2017-01-04: qty 30

## 2017-01-04 MED ORDER — SULFAMETHOXAZOLE-TRIMETHOPRIM 800-160 MG PO TABS: 1.0000 | ORAL_TABLET | Freq: Two times a day (BID) | ORAL | 0 refills | Status: AC

## 2017-01-04 MED ORDER — CLINDAMYCIN PHOSPHATE 600 MG/50ML IV SOLN
600.0000 mg | Freq: Once | INTRAVENOUS | Status: AC
  Administered 2017-01-04: 600 mg via INTRAVENOUS
  Filled 2017-01-04: qty 50

## 2017-01-04 MED ORDER — TRAMADOL HCL 50 MG PO TABS: 50.0000 mg | ORAL_TABLET | Freq: Four times a day (QID) | ORAL | 0 refills | Status: DC | PRN

## 2017-01-04 MED ORDER — NAPROXEN 500 MG PO TABS
500.0000 mg | ORAL_TABLET | Freq: Once | ORAL | Status: AC
  Administered 2017-01-04: 500 mg via ORAL
  Filled 2017-01-04: qty 1

## 2017-01-04 NOTE — ED Provider Notes (Signed)
[facility] Emergency Department Provider Note    First MD Initiated Contact with Patient 01/04/17 1617     (approximate)  I have reviewed the triage vital signs and the nursing notes.   HISTORY  Chief Complaint Abscess    HPI Leon Riley is a 36 y.o. male history of IV drug abuse reportedly clean for the past several years presents with severe swelling and 10 out of 10 in burning throbbing pain to his dorsal right hand. Patient is right-hand dominant. States that developed abscess that she's had in the past to the right hand several days ago he tried to drain it himself at home. Because he probably made it worse. Is having trouble closing his hand due to swelling on the dorsal aspect. Denies any fevers. Main complaint is pain.   Past Medical History:  Diagnosis Date  . IV drug abuse (HCC)   . Spontaneous pneumothorax    No family history on file. History reviewed. No pertinent surgical history. There are no active problems to display for this patient.     Prior to Admission medications   Medication Sig Start Date End Date Taking? Authorizing Provider  ibuprofen (ADVIL,MOTRIN) 600 MG tablet Take 1 tablet (600 mg total) by mouth every 8 (eight) hours as needed. 11/20/15   Joni Reining, PA-C  Naloxone HCl 0.4 MG/0.4ML SOAJ Inject 0.4 mg as directed once as needed (heroin overdose). Inject 1 mL intramuscularly upon signs of opioid overdose. May repeat X 1. Call 911. 05/03/15   Tomasita Crumble, MD  oxyCODONE-acetaminophen (PERCOCET) 7.5-325 MG tablet Take 1 tablet by mouth every 4 (four) hours as needed for severe pain. Patient not taking: Reported on 01/04/2017 11/20/15   Joni Reining, PA-C  sulfamethoxazole-trimethoprim (BACTRIM DS) 800-160 MG per tablet Take 1 tablet by mouth 2 (two) times daily. Patient not taking: Reported on 01/04/2017 11/27/14   Sharman Cheek, MD  sulfamethoxazole-trimethoprim (BACTRIM DS,SEPTRA DS) 800-160 MG tablet Take 1  tablet by mouth 2 (two) times daily. 01/04/17 01/14/17  Willy Eddy, MD  traMADol (ULTRAM) 50 MG tablet Take 1 tablet (50 mg total) by mouth every 6 (six) hours as needed. Patient not taking: Reported on 01/04/2017 11/04/16 11/04/17  Willy Eddy, MD  traMADol (ULTRAM) 50 MG tablet Take 1 tablet (50 mg total) by mouth every 6 (six) hours as needed. 01/04/17 01/04/18  Willy Eddy, MD    Allergies Patient has no known allergies.    Social History Social History  Substance Use Topics  . Smoking status: Current Every Day Smoker  . Smokeless tobacco: Never Used  . Alcohol use Yes    Review of Systems Patient denies headaches, rhinorrhea, blurry vision, numbness, shortness of breath, chest pain, edema, cough, abdominal pain, nausea, vomiting, diarrhea, dysuria, fevers, rashes or hallucinations unless otherwise stated above in HPI. ____________________________________________   PHYSICAL EXAM:  VITAL SIGNS: Vitals:   01/04/17 1419 01/04/17 1935  BP: 119/87 132/88  Pulse: (!) 116 99  Resp: 20 (!) 22  Temp: 98.9 F (37.2 C)   SpO2: 98%     Constitutional: Alert and oriented. Well appearing and in no acute distress. Eyes: Conjunctivae are normal.  Head: Atraumatic. Nose: No congestion/rhinnorhea. Mouth/Throat: Mucous membranes are moist.   Neck: No stridor. Painless ROM.  Cardiovascular: Normal rate, regular rhythm. Grossly normal heart sounds.  Good peripheral circulation. Respiratory: Normal respiratory effort.  No retractions. Lungs CTAB. Gastrointestinal: Soft and nontender. No distention. No abdominal bruits. No CVA tenderness. Musculoskeletal: No lower extremity  tenderness nor edema.  No joint effusions.  3 cm fluctuant abscess on the dorsal aspect of his hand in between the second and third MCP. There is streaking cellulitis going to the midforearm. Distal perfusion is brisk. No neuro deficits distally. He has no tenderness or swelling along the flexor tendon  sheath. Neurologic:  Normal speech and language. No gross focal neurologic deficits are appreciated. No facial droop Skin:  Cellulitis of right hand as above,  Small fluctuant abscess of left armpit no significant cellulitis Psychiatric: Mood and affect are normal. Speech and behavior are normal.  ____________________________________________   LABS (all labs ordered are listed, but only abnormal results are displayed)  Results for orders placed or performed during the hospital encounter of 01/04/17 (from the past 24 hour(s))  Lactic acid, plasma     Status: None   Collection Time: 01/04/17  2:28 PM  Result Value Ref Range   Lactic Acid, Venous 0.8 0.5 - 1.9 mmol/L  CBC with Differential     Status: Abnormal   Collection Time: 01/04/17  2:28 PM  Result Value Ref Range   WBC 15.0 (H) 3.8 - 10.6 K/uL   RBC 5.13 4.40 - 5.90 MIL/uL   Hemoglobin 14.9 13.0 - 18.0 g/dL   HCT 16.1 09.6 - 04.5 %   MCV 83.9 80.0 - 100.0 fL   MCH 29.0 26.0 - 34.0 pg   MCHC 34.6 32.0 - 36.0 g/dL   RDW 40.9 81.1 - 91.4 %   Platelets 209 150 - 440 K/uL   Neutrophils Relative % 77 %   Neutro Abs 11.4 (H) 1.4 - 6.5 K/uL   Lymphocytes Relative 15 %   Lymphs Abs 2.3 1.0 - 3.6 K/uL   Monocytes Relative 7 %   Monocytes Absolute 1.1 (H) 0.2 - 1.0 K/uL   Eosinophils Relative 1 %   Eosinophils Absolute 0.2 0 - 0.7 K/uL   Basophils Relative 0 %   Basophils Absolute 0.1 0 - 0.1 K/uL  Comprehensive metabolic panel     Status: Abnormal   Collection Time: 01/04/17  2:47 PM  Result Value Ref Range   Sodium 138 135 - 145 mmol/L   Potassium 4.1 3.5 - 5.1 mmol/L   Chloride 103 101 - 111 mmol/L   CO2 24 22 - 32 mmol/L   Glucose, Bld 99 65 - 99 mg/dL   BUN 12 6 - 20 mg/dL   Creatinine, Ser 7.82 0.61 - 1.24 mg/dL   Calcium 95.6 8.9 - 21.3 mg/dL   Total Protein 9.4 (H) 6.5 - 8.1 g/dL   Albumin 4.5 3.5 - 5.0 g/dL   AST 26 15 - 41 U/L   ALT 24 17 - 63 U/L   Alkaline Phosphatase 118 38 - 126 U/L   Total Bilirubin 0.9  0.3 - 1.2 mg/dL   GFR calc non Af Amer >60 >60 mL/min   GFR calc Af Amer >60 >60 mL/min   Anion gap 11 5 - 15   ____________________________________________ ____________________________________________  RADIOLOGY  I personally reviewed all radiographic images ordered to evaluate for the above acute complaints and reviewed radiology reports and findings.  These findings were personally discussed with the patient.  Please see medical record for radiology report.   EMERGENCY DEPARTMENT US SOFT TISSUE INTERPRETATION "Study: Limited Soft Tissue Ultrasound"  INDICATIONS: Pain and Soft tissue infection Multiple views of the body part were obtained in real-time with a multi-frequency linear probe  PERFORMED BY: Myself IMAGES ARCHIVED?: Yes SIDE:Right  BODY PART:Upper extremity  INTERPRETATION:  Abcess present and Cellulitis present   ____________________________________________   PROCEDURES  Procedure(s) performed:  Marland KitchenMarland KitchenIncision and Drainage Date/Time: 01/04/2017 6:36 PM Performed by: Willy Eddy Authorized by: Willy Eddy   Consent:    Consent obtained:  Verbal   Consent given by:  Patient   Risks discussed:  Bleeding, damage to other organs, incomplete drainage, infection and pain Location:    Type:  Abscess   Size:  3   Location:  Upper extremity   Upper extremity location:  Hand   Hand location:  R hand Pre-procedure details:    Skin preparation:  Betadine Anesthesia (see MAR for exact dosages):    Anesthesia method:  Local infiltration and nerve block   Local anesthetic:  Bupivacaine 0.25% w/o epi and lidocaine 1% w/o epi   Block location:  Radial nerve block   Block needle gauge:  25 G   Block anesthetic:  Bupivacaine 0.25% w/o epi   Block technique:  US guided   Block injection procedure:  Incremental injection   Block outcome:  Incomplete block Procedure type:    Complexity:  Simple Procedure details:    Incision types:  Single straight   Incision  depth:  Dermal   Scalpel blade:  11   Wound management:  Probed and deloculated   Drainage:  Purulent   Drainage amount:  Moderate   Wound treatment:  Wound left open   Packing materials:  None      Critical Care performed: no ____________________________________________   INITIAL IMPRESSION / ASSESSMENT AND PLAN / ED COURSE  Pertinent labs & imaging results that were available during my care of the patient were reviewed by me and considered in my medical decision making (see chart for details).  DDX: abscess, nec fasc, nsti, cellulitis, retained FB  Leon Riley is a 36 y.o. who presents to the ED with with 3 cm abscess for last several days. No systemic symptoms, no fevers. VSS. Exam with small amount of surrounding erythema, c/w abscess and cellulitis.  Clinical picture is not consistent with necrotizing fasc or osteomyelitis.  No retained FB on XR.  I & D of abscess done w/o complications. Area explored, drained as above.     Clinical Course as of Jan 05 2015  Tue Jan 04, 2017  1932 Patient is requesting discharge home. I redictated due to concern for worsening infection I would recommend admission to the hospital for further evaluation and management and continued IV hydration and IV antibiotics the patient states that he has full understanding of the risks including worsening of condition, sepsis, loss of function of the right hand worsening pain and/or morbidity and will leave AGAINST MEDICAL ADVICE.  Will start on abx and give referral to ortho hand.  PAtient remains well appearing at this time. Have discussed with the patient and available family all diagnostics and treatments performed thus far and all questions were answered to the best of my ability. The patient demonstrates understanding and agreement with plan.   [PR]    Clinical Course User Index [PR] Willy Eddy, MD     ____________________________________________   FINAL CLINICAL IMPRESSION(S) / ED  DIAGNOSES  Final diagnoses:  Abscess  Cellulitis of right upper extremity      NEW MEDICATIONS STARTED DURING THIS VISIT:  New Prescriptions   SULFAMETHOXAZOLE-TRIMETHOPRIM (BACTRIM DS,SEPTRA DS) 800-160 MG TABLET    Take 1 tablet by mouth 2 (two) times daily.   TRAMADOL (ULTRAM) 50 MG TABLET    Take 1 tablet (50  mg total) by mouth every 6 (six) hours as needed.     Note:  This document was prepared using Dragon voice recognition software and may include unintentional dictation errors.    Willy Eddy, MD 01/04/17 2016

## 2017-01-04 NOTE — ED Notes (Signed)
20g iv started by dr Roxan Hockey via u/s in left upper arm  Pt tolerated without diff.  Iv fluids infusing   meds given

## 2017-01-04 NOTE — ED Notes (Signed)
Pt stuck X 2 by Orvilla Fus, EMTP

## 2017-01-04 NOTE — ED Notes (Signed)
Abscess opened and drained by dr Roxan Hockey .  Pt tolerated well.  Pain meds given again.

## 2017-01-04 NOTE — ED Notes (Signed)
Pt signed esignature.  D/c inst to pt with prescriptions.  Iv dc'ed.  Right hand bandaged with gauze.

## 2017-01-04 NOTE — ED Triage Notes (Addendum)
Pt c/o right hand swelling, pain and abscess since Friday. Left armpit abscess and right foot abscess. Pt denies IV drug use recently. Pt right hand very swollen, reddened, tender to touch. Redness radiates up to approx half of forearm. Unable to have full ROM of right hand. Pt tried to open abscess on his own, states it made it worse.

## 2017-02-26 ENCOUNTER — Other Ambulatory Visit: Payer: Self-pay

## 2017-02-26 ENCOUNTER — Inpatient Hospital Stay
Admission: EM | Admit: 2017-02-26 | Discharge: 2017-03-01 | DRG: 603 | Disposition: A | Discharge status: Home / Self Care | Payer: Self-pay | Attending: Internal Medicine | Admitting: Internal Medicine

## 2017-02-26 DIAGNOSIS — L0390 Cellulitis, unspecified: Secondary | ICD-10-CM

## 2017-02-26 DIAGNOSIS — E876 Hypokalemia: Secondary | ICD-10-CM | POA: Diagnosis not present

## 2017-02-26 DIAGNOSIS — R609 Edema, unspecified: Secondary | ICD-10-CM

## 2017-02-26 DIAGNOSIS — Z7151 Drug abuse counseling and surveillance of drug abuser: Secondary | ICD-10-CM

## 2017-02-26 DIAGNOSIS — E860 Dehydration: Secondary | ICD-10-CM | POA: Diagnosis present

## 2017-02-26 DIAGNOSIS — F17200 Nicotine dependence, unspecified, uncomplicated: Secondary | ICD-10-CM | POA: Diagnosis present

## 2017-02-26 DIAGNOSIS — F1910 Other psychoactive substance abuse, uncomplicated: Secondary | ICD-10-CM | POA: Diagnosis present

## 2017-02-26 DIAGNOSIS — L03116 Cellulitis of left lower limb: Principal | ICD-10-CM | POA: Diagnosis present

## 2017-02-26 LAB — CBC WITH DIFFERENTIAL/PLATELET
Basophils Absolute: 0 10*3/uL (ref 0–0.1)
Basophils Relative: 0 %
Eosinophils Absolute: 0 10*3/uL (ref 0–0.7)
Eosinophils Relative: 0 %
HEMATOCRIT: 42.1 % (ref 40.0–52.0)
HEMOGLOBIN: 14.4 g/dL (ref 13.0–18.0)
LYMPHS ABS: 0.5 10*3/uL — AB (ref 1.0–3.6)
Lymphocytes Relative: 3 %
MCH: 28.2 pg (ref 26.0–34.0)
MCHC: 34.2 g/dL (ref 32.0–36.0)
MCV: 82.3 fL (ref 80.0–100.0)
MONOS PCT: 6 %
Monocytes Absolute: 1.1 10*3/uL — ABNORMAL HIGH (ref 0.2–1.0)
NEUTROS ABS: 17.2 10*3/uL — AB (ref 1.4–6.5)
NEUTROS PCT: 91 %
Platelets: 197 10*3/uL (ref 150–440)
RBC: 5.11 MIL/uL (ref 4.40–5.90)
RDW: 14 % (ref 11.5–14.5)
WBC: 18.9 10*3/uL — ABNORMAL HIGH (ref 3.8–10.6)

## 2017-02-26 LAB — BASIC METABOLIC PANEL
Anion gap: 19 — ABNORMAL HIGH (ref 5–15)
BUN: 12 mg/dL (ref 6–20)
CHLORIDE: 99 mmol/L — AB (ref 101–111)
CO2: 20 mmol/L — AB (ref 22–32)
CREATININE: 1 mg/dL (ref 0.61–1.24)
Calcium: 9.9 mg/dL (ref 8.9–10.3)
GFR calc Af Amer: >60 mL/min (ref >60)
GFR calc non Af Amer: >60 mL/min (ref >60)
Glucose, Bld: 145 mg/dL — ABNORMAL HIGH (ref 65–99)
POTASSIUM: 4 mmol/L (ref 3.5–5.1)
Sodium: 138 mmol/L (ref 135–145)

## 2017-02-26 LAB — LACTIC ACID, PLASMA: Lactic Acid, Venous: 1.9 mmol/L (ref 0.5–1.9)

## 2017-02-26 MED ORDER — ACETAMINOPHEN 650 MG RE SUPP: 650.0000 mg | Freq: Four times a day (QID) | RECTAL | Status: DC | PRN

## 2017-02-26 MED ORDER — SODIUM CHLORIDE 0.9 % IV BOLUS (SEPSIS)
1000.0000 mL | Freq: Once | INTRAVENOUS | Status: AC
  Administered 2017-02-26: 1000 mL via INTRAVENOUS

## 2017-02-26 MED ORDER — ONDANSETRON HCL 4 MG PO TABS: 4.0000 mg | ORAL_TABLET | Freq: Four times a day (QID) | ORAL | Status: DC | PRN

## 2017-02-26 MED ORDER — LIDOCAINE HCL (PF) 1 % IJ SOLN
INTRAMUSCULAR | Status: AC
  Filled 2017-02-26: qty 5

## 2017-02-26 MED ORDER — ENOXAPARIN SODIUM 40 MG/0.4ML ~~LOC~~ SOLN
40.0000 mg | SUBCUTANEOUS | Status: DC
  Administered 2017-02-26 – 2017-02-28 (×3): 40 mg via SUBCUTANEOUS
  Filled 2017-02-26 (×3): qty 0.4

## 2017-02-26 MED ORDER — VANCOMYCIN HCL 10 G IV SOLR
1250.0000 mg | Freq: Two times a day (BID) | INTRAVENOUS | Status: DC
  Administered 2017-02-26 – 2017-02-27 (×3): 1250 mg via INTRAVENOUS
  Filled 2017-02-26 (×5): qty 1250

## 2017-02-26 MED ORDER — ONDANSETRON HCL 4 MG/2ML IJ SOLN: 4.0000 mg | Freq: Four times a day (QID) | INTRAMUSCULAR | Status: DC | PRN

## 2017-02-26 MED ORDER — KETOROLAC TROMETHAMINE 30 MG/ML IJ SOLN
30.0000 mg | Freq: Once | INTRAMUSCULAR | Status: AC
  Administered 2017-02-26: 30 mg via INTRAVENOUS
  Filled 2017-02-26: qty 1

## 2017-02-26 MED ORDER — HYDROMORPHONE HCL 1 MG/ML IJ SOLN
1.0000 mg | INTRAMUSCULAR | Status: DC | PRN
  Administered 2017-02-26 – 2017-02-28 (×2): 1 mg via INTRAVENOUS
  Filled 2017-02-26 (×2): qty 1

## 2017-02-26 MED ORDER — PNEUMOCOCCAL VAC POLYVALENT 25 MCG/0.5ML IJ INJ: 0.5000 mL | INJECTION | INTRAMUSCULAR | Status: DC

## 2017-02-26 MED ORDER — ACETAMINOPHEN 325 MG PO TABS: 650.0000 mg | ORAL_TABLET | Freq: Four times a day (QID) | ORAL | Status: DC | PRN

## 2017-02-26 MED ORDER — HYDROCODONE-ACETAMINOPHEN 5-325 MG PO TABS
1.0000 | ORAL_TABLET | ORAL | Status: DC | PRN
  Administered 2017-02-26 – 2017-02-28 (×5): 2 via ORAL
  Administered 2017-02-28: 1 via ORAL
  Administered 2017-02-28 – 2017-03-01 (×4): 2 via ORAL
  Filled 2017-02-26 (×10): qty 2

## 2017-02-26 MED ORDER — VANCOMYCIN HCL IN DEXTROSE 1-5 GM/200ML-% IV SOLN
1000.0000 mg | Freq: Once | INTRAVENOUS | Status: AC
  Administered 2017-02-26: 1000 mg via INTRAVENOUS
  Filled 2017-02-26: qty 200

## 2017-02-26 MED ORDER — SODIUM CHLORIDE 0.9 % IV SOLN
3.0000 g | Freq: Four times a day (QID) | INTRAVENOUS | Status: DC
  Administered 2017-02-26 – 2017-03-01 (×10): 3 g via INTRAVENOUS
  Filled 2017-02-26 (×14): qty 3

## 2017-02-26 MED ORDER — SODIUM CHLORIDE 0.9 % IV SOLN
INTRAVENOUS | Status: DC
  Administered 2017-02-26 – 2017-02-28 (×4): via INTRAVENOUS

## 2017-02-26 NOTE — H&P (Signed)
[facility] [place] [facility]   PATIENT NAME: Leon Riley    MR#:  [id number]  DATE OF BIRTH:  July 16, 1980  DATE OF ADMISSION:  02/26/2017  PRIMARY CARE PHYSICIAN: [facility]   REQUESTING/REFERRING PHYSICIAN: Phineas SemenGoodman, Graydon MD  CHIEF COMPLAINT:   Chief Complaint  Patient presents with  . Foot Pain    HISTORY OF PRESENT ILLNESS: Leon FickRaymond Pickrell  is a 36 y.o. male with a known history of  IVDU presents with left foot swelling started few days ago after he injected his foot with IV drug.   Pt states he having trouble walking. Pt also having fever.   No chest pain.     PAST MEDICAL HISTORY:   Past Medical History:  Diagnosis Date  . IV drug abuse (HCC)   . Spontaneous pneumothorax     PAST SURGICAL HISTORY:  Past Surgical History:  Procedure Laterality Date  . CHEST TUBE INSERTION      SOCIAL HISTORY:  Social History   Tobacco Use  . Smoking status: Current Every Day Smoker  . Smokeless tobacco: Never Used  Substance Use Topics  . Alcohol use: Yes    FAMILY HISTORY:  Family History  Problem Relation Age of Onset  . Diabetes Father     DRUG ALLERGIES: No Known Allergies  REVIEW OF SYSTEMS:   CONSTITUTIONAL: + fever, fatigue or weakness.  EYES: No blurred or double vision.  EARS, NOSE, AND THROAT: No tinnitus or ear pain.  RESPIRATORY: No cough, shortness of breath, wheezing or hemoptysis.  CARDIOVASCULAR: No chest pain, orthopnea, edema.  GASTROINTESTINAL: No nausea, vomiting, diarrhea or abdominal pain.  GENITOURINARY: No dysuria, hematuria.  ENDOCRINE: No polyuria, nocturia,  HEMATOLOGY: No anemia, easy bruising or bleeding SKIN: left foot swelling and erythema MUSCULOSKELETAL: No joint pain or arthritis.   NEUROLOGIC: No tingling, numbness, weakness.  PSYCHIATRY: No anxiety or depression.   MEDICATIONS AT HOME:  Prior to Admission medications   Medication Sig Start Date End Date Taking? Authorizing Provider   Naloxone HCl 0.4 MG/0.4ML SOAJ Inject 0.4 mg as directed once as needed (heroin overdose). Inject 1 mL intramuscularly upon signs of opioid overdose. May repeat X 1. Call 911. 05/03/15  Yes Tomasita Crumbleni, Adeleke, MD  ibuprofen (ADVIL,MOTRIN) 600 MG tablet Take 1 tablet (600 mg total) by mouth every 8 (eight) hours as needed. Patient not taking: Reported on 02/26/2017 11/20/15   Joni ReiningSmith, Ronald K, PA-C  oxyCODONE-acetaminophen (PERCOCET) 7.5-325 MG tablet Take 1 tablet by mouth every 4 (four) hours as needed for severe pain. Patient not taking: Reported on 01/04/2017 11/20/15   Joni ReiningSmith, Ronald K, PA-C  sulfamethoxazole-trimethoprim (BACTRIM DS) 800-160 MG per tablet Take 1 tablet by mouth 2 (two) times daily. Patient not taking: Reported on 01/04/2017 11/27/14   Sharman CheekStafford, Phillip, MD  traMADol (ULTRAM) 50 MG tablet Take 1 tablet (50 mg total) by mouth every 6 (six) hours as needed. Patient not taking: Reported on 01/04/2017 11/04/16 11/04/17  Willy Eddyobinson, Patrick, MD  traMADol (ULTRAM) 50 MG tablet Take 1 tablet (50 mg total) by mouth every 6 (six) hours as needed. Patient not taking: Reported on 02/26/2017 01/04/17 01/04/18  Willy Eddyobinson, Patrick, MD      PHYSICAL EXAMINATION:   VITAL SIGNS: Blood pressure 111/64, pulse (!) 105, temperature 100.2 F (37.9 C), temperature source Oral, resp. rate (!) 35, height 5\' 11"  (1.803 m), weight 150 lb (68 kg), SpO2 96 %.  GENERAL:  36 y.o.-year-old patient lying in the bed with no acute distress.  EYES:  Pupils equal, round, reactive to light and accommodation. No scleral icterus. Extraocular muscles intact.  HEENT: Head atraumatic, normocephalic. Oropharynx and nasopharynx clear.  NECK:  Supple, no jugular venous distention. No thyroid enlargement, no tenderness.  LUNGS: Normal breath sounds bilaterally, no wheezing, rales,rhonchi or crepitation. No use of accessory muscles of respiration.  CARDIOVASCULAR: S1, S2 normal. No murmurs, rubs, or gallops.  ABDOMEN: Soft, nontender,  nondistended. Bowel sounds present. No organomegaly or mass.  EXTREMITIES: No pedal edema, cyanosis, or clubbing.  NEUROLOGIC: Cranial nerves II through XII are intact. Muscle strength 5/5 in all extremities. Sensation intact. Gait not checked.  PSYCHIATRIC: The patient is alert and oriented x 3.  SKIN: + swelling of the left foot associated with erythema LABORATORY PANEL:   CBC Recent Labs  Lab 02/26/17 1807  WBC 18.9*  HGB 14.4  HCT 42.1  PLT 197  MCV 82.3  MCH 28.2  MCHC 34.2  RDW 14.0  LYMPHSABS 0.5*  MONOABS 1.1*  EOSABS 0.0  BASOSABS 0.0   ------------------------------------------------------------------------------------------------------------------  Chemistries  Recent Labs  Lab 02/26/17 1807  NA 138  K 4.0  CL 99*  CO2 20*  GLUCOSE 145*  BUN 12  CREATININE 1.00  CALCIUM 9.9   ------------------------------------------------------------------------------------------------------------------ estimated creatinine clearance is 99.2 mL/min (by C-G formula based on SCr of 1 mg/dL). ------------------------------------------------------------------------------------------------------------------ No results for input(s): TSH, T4TOTAL, T3FREE, THYROIDAB in the last 72 hours.  Invalid input(s): FREET3   Coagulation profile No results for input(s): INR, PROTIME in the last 168 hours. ------------------------------------------------------------------------------------------------------------------- No results for input(s): DDIMER in the last 72 hours. -------------------------------------------------------------------------------------------------------------------  Cardiac Enzymes No results for input(s): CKMB, TROPONINI, MYOGLOBIN in the last 168 hours.  Invalid input(s): CK ------------------------------------------------------------------------------------------------------------------ Invalid input(s):  POCBNP  ---------------------------------------------------------------------------------------------------------------  Urinalysis No results found for: COLORURINE, APPEARANCEUR, LABSPEC, PHURINE, GLUCOSEU, HGBUR, BILIRUBINUR, KETONESUR, PROTEINUR, UROBILINOGEN, NITRITE, LEUKOCYTESUR   RADIOLOGY: No results found.  EKG: No orders found for this or any previous visit.  IMPRESSION AND PLAN: Patient is a 36 year old white male with IV drug use presenting with left foot cellulitis  1.  Left foot cellulitis in a patient with IV drug use At this point I will treat patient with IV vancomycin and Unasyn Follow blood cultures Obtain HIV  2.  Sinus tachycardia due to #1 we will give IV fluids  3.  Abnormal blood glucose check a hemoglobin A1c  4.  Nicotine abuse smoking cessation provided 4 minutes spent I strongly recommended he stop smoking I offered him a nicotine patch he states that he does not want one now but he may ask for 1  All the records are reviewed and case discussed with ED provider. Management plans discussed with the patient, family and they are in agreement.  CODE STATUS: Code Status History    This patient does not have a recorded code status. Please follow your organizational policy for patients in this situation.       TOTAL TIME TAKING CARE OF THIS PATIENT: 55 minutes.    Auburn BilberryPATEL, Camden Mazzaferro M.D on 02/26/2017 at 7:56 PM  Between 7am to 6pm - Pager - [phone]  After 6pm go to [email or web address] - password [email or web address] [facility]  [facility]  Office  [phone]  CC: Primary care physician; [facility], [facility]

## 2017-02-26 NOTE — ED Notes (Signed)
Patient states he's a IV drug abuser and has messed up all of his veins. He states he has now resorted to using his feet and legs to find veins for his IV drugs.

## 2017-02-26 NOTE — Progress Notes (Signed)
ANTIBIOTIC CONSULT NOTE - INITIAL  Pharmacy Consult for Unasyn and vancomycin Indication: Cellulitis (per EDP "exam not consistent with an abscess")  No Known Allergies  Patient Measurements: Height: 5\' 11"  (180.3 cm) Weight: 150 lb (68 kg) IBW/kg (Calculated) : 75.3 Adjusted Body Weight:   Vital Signs: Temp: 100.2 F (37.9 C) (12/01 1849) Temp Source: Oral (12/01 1849) BP: 137/83 (12/01 1849) Pulse Rate: 115 (12/01 1849) Intake/Output from previous day: No intake/output data recorded. Intake/Output from this shift: No intake/output data recorded.  Labs: Recent Labs    02/26/17 1807  WBC 18.9*  HGB 14.4  PLT 197  CREATININE 1.00   Estimated Creatinine Clearance: 99.2 mL/min (by C-G formula based on SCr of 1 mg/dL). No results for input(s): VANCOTROUGH, VANCOPEAK, VANCORANDOM, GENTTROUGH, GENTPEAK, GENTRANDOM, TOBRATROUGH, TOBRAPEAK, TOBRARND, AMIKACINPEAK, AMIKACINTROU, AMIKACIN in the last 72 hours.   Microbiology: No results found for this or any previous visit (from the past 720 hour(s)).  Medical History: Past Medical History:  Diagnosis Date  . IV drug abuse (HCC)   . Spontaneous pneumothorax     Medications:  Infusions:  . ampicillin-sulbactam (UNASYN) IV    . [START ON 02/27/2017] vancomycin     Assessment: 36 yom cc foot pain, PMH includes abscesses and skin infections, IVDA, spontaneous pneumothorax.   Goal of Therapy:  Vancomycin trough level 10-15 mcg/ml  Resolve infection Prevent ADE  Plan:  Expected duration 7 days with resolution of temperature and/or normalization of WBC  1. Unasyn 3 gm IV Q6H 2. Vancomycin 1 gm IV x 1 in ED followed in approximately 6 hours (stacked dosing) by vancomycin 1.25 gm IV Q12H, predicted trough 14 mcg/mL. Pharmacy will continue to follow and adjust as needed to maintain trough 10 to 15 mcg/mL.   Vd 52.7 L, Ke 0.087 hr-1, T1/2 8 hr  Carola FrostNathan A Virgil Lightner, Pharm.D., BCPS Clinical Pharmacist 02/26/2017,7:39 PM

## 2017-02-26 NOTE — ED Triage Notes (Signed)
Pt states that he is having pain in his L foot, and believes that he has an infection from shooting up in his foot.  Pt states he last shot up in his foot on Tuesday or Wednesday.  Pt states needles were new.

## 2017-02-26 NOTE — ED Notes (Signed)
Very difficult vascular access, RN Hunter attempted twice with ultrasound guidance, Dorinda HillDonald had 1 failed attempt and 1 successful attempt.

## 2017-02-26 NOTE — ED Provider Notes (Signed)
[facility] Emergency Department Provider Note    ____________________________________________   I have reviewed the triage vital signs and the nursing notes.   HISTORY  Chief Complaint Foot Pain   History limited by: Not Limited   HPI Leon Riley is a 36 y.o. male who presents to the emergency department today because of foot pain.   LOCATION:left foot/ankle DURATION:3 days TIMING: constant, worsening SEVERITY: severe QUALITY: painful CONTEXT: patient states that he shot up in that area prior to the pain starting. States he has had abscesses and skin infections in the past. MODIFYING FACTORS: worse with movement/palpation ASSOCIATED SYMPTOMS: has had fevers.  Per medical record review patient has a history of iv drug use, infections.   Past Medical History:  Diagnosis Date  . IV drug abuse (HCC)   . Spontaneous pneumothorax     There are no active problems to display for this patient.   History reviewed. No pertinent surgical history.  Prior to Admission medications   Medication Sig Start Date End Date Taking? Authorizing Provider  ibuprofen (ADVIL,MOTRIN) 600 MG tablet Take 1 tablet (600 mg total) by mouth every 8 (eight) hours as needed. 11/20/15   Joni ReiningSmith, Ronald K, PA-C  Naloxone HCl 0.4 MG/0.4ML SOAJ Inject 0.4 mg as directed once as needed (heroin overdose). Inject 1 mL intramuscularly upon signs of opioid overdose. May repeat X 1. Call 911. 05/03/15   Tomasita Crumbleni, Adeleke, MD  oxyCODONE-acetaminophen (PERCOCET) 7.5-325 MG tablet Take 1 tablet by mouth every 4 (four) hours as needed for severe pain. Patient not taking: Reported on 01/04/2017 11/20/15   Joni ReiningSmith, Ronald K, PA-C  sulfamethoxazole-trimethoprim (BACTRIM DS) 800-160 MG per tablet Take 1 tablet by mouth 2 (two) times daily. Patient not taking: Reported on 01/04/2017 11/27/14   Sharman CheekStafford, Phillip, MD  traMADol (ULTRAM) 50 MG tablet Take 1 tablet (50 mg total) by mouth every 6 (six) hours as  needed. Patient not taking: Reported on 01/04/2017 11/04/16 11/04/17  Willy Eddyobinson, Patrick, MD  traMADol (ULTRAM) 50 MG tablet Take 1 tablet (50 mg total) by mouth every 6 (six) hours as needed. 01/04/17 01/04/18  Willy Eddyobinson, Patrick, MD    Allergies Patient has no known allergies.  No family history on file.  Social History Social History   Tobacco Use  . Smoking status: Current Every Day Smoker  . Smokeless tobacco: Never Used  Substance Use Topics  . Alcohol use: Yes  . Drug use: Yes    Types: IV, Cocaine    Comment: Pt states he shoots up cocaine approx 3x week    Review of Systems Constitutional: Positive for fevers. Eyes: No visual changes. ENT: No sore throat. Cardiovascular: Denies chest pain. Respiratory: Denies shortness of breath. Gastrointestinal: No abdominal pain.  No nausea, no vomiting.  No diarrhea.   Genitourinary: Negative for dysuria. Musculoskeletal: Positive for left ankle/foot pain. Skin: Positive for redness to the left foot. Neurological: Negative for headaches, focal weakness or numbness.  ____________________________________________   PHYSICAL EXAM:  VITAL SIGNS: ED Triage Vitals  Enc Vitals Group     BP 02/26/17 1633 134/85     Pulse Rate 02/26/17 1633 (!) 112     Resp 02/26/17 1633 (!) 24     Temp 02/26/17 1633 98.6 F (37 C)     Temp Source 02/26/17 1633 Oral     SpO2 02/26/17 1633 99 %     Weight 02/26/17 1634 150 lb (68 kg)     Height --      Head Circumference --  Peak Flow --      Pain Score 02/26/17 1633 10   Constitutional: Alert and oriented. Well appearing and in no distress. Eyes: Conjunctivae are normal.  ENT   Head: Normocephalic and atraumatic.   Nose: No congestion/rhinnorhea.   Mouth/Throat: Mucous membranes are moist.   Neck: No stridor. Hematological/Lymphatic/Immunilogical: No cervical lymphadenopathy. Cardiovascular: Normal rate, regular rhythm.  No murmurs, rubs, or gallops. Respiratory: Normal  respiratory effort without tachypnea nor retractions. Breath sounds are clear and equal bilaterally. No wheezes/rales/rhonchi. Gastrointestinal: Soft and non tender. No rebound. No guarding.  Genitourinary: Deferred Musculoskeletal: Normal range of motion in all extremities. No lower extremity edema. Neurologic:  Normal speech and language. No gross focal neurologic deficits are appreciated.  Skin:  Skin is warm, dry and intact. No rash noted. Psychiatric: Mood and affect are normal. Speech and behavior are normal. Patient exhibits appropriate insight and judgment.  ____________________________________________    LABS (pertinent positives/negatives)  CBC wbc 18.9, hgb 14.4, plt 197 BMP glu 145 Lactic 1.9  ____________________________________________   EKG  I, Phineas SemenGraydon Tait Balistreri, attending physician, personally viewed and interpreted this EKG  EKG Time: 1655 Rate: 103 Rhythm: sinus tachycardia Axis: normal Intervals: qtc 442 QRS: narrow ST changes: no st elevation Impression: abnormal ekg   ____________________________________________    RADIOLOGY  None  ____________________________________________   PROCEDURES  Procedures  ____________________________________________   INITIAL IMPRESSION / ASSESSMENT AND PLAN / ED COURSE  Pertinent labs & imaging results that were available during my care of the patient were reviewed by me and considered in my medical decision making (see chart for details).  Patient presented to the emergency department today because of concerns for cellulitis vs abscess, however exam not consistent with an abscess. Patient blood work is concerning for elevated white count. Given tachycardia, elevated white count do think patient would benefit from inpatient admission for continued IV antibiotics. Discussed findings and plan with patient.   ____________________________________________   FINAL CLINICAL IMPRESSION(S) / ED DIAGNOSES  Final  diagnoses:  Cellulitis, unspecified cellulitis site  Substance abuse [facility](HCC)     Note: This dictation was prepared with Dragon dictation. Any transcriptional errors that result from this process are unintentional     Phineas SemenGoodman, Solan Vosler, MD 02/26/17 [id number]

## 2017-02-27 LAB — BASIC METABOLIC PANEL
Anion gap: 10 (ref 5–15)
BUN: 13 mg/dL (ref 6–20)
CHLORIDE: 104 mmol/L (ref 101–111)
CO2: 21 mmol/L — ABNORMAL LOW (ref 22–32)
CREATININE: 1.13 mg/dL (ref 0.61–1.24)
Calcium: 8.7 mg/dL — ABNORMAL LOW (ref 8.9–10.3)
Glucose, Bld: 130 mg/dL — ABNORMAL HIGH (ref 65–99)
Potassium: 3.3 mmol/L — ABNORMAL LOW (ref 3.5–5.1)
SODIUM: 135 mmol/L (ref 135–145)

## 2017-02-27 LAB — CBC
HCT: 39.6 % — ABNORMAL LOW (ref 40.0–52.0)
HEMOGLOBIN: 13.3 g/dL (ref 13.0–18.0)
MCH: 27.7 pg (ref 26.0–34.0)
MCHC: 33.6 g/dL (ref 32.0–36.0)
MCV: 82.5 fL (ref 80.0–100.0)
PLATELETS: 190 10*3/uL (ref 150–440)
RBC: 4.8 MIL/uL (ref 4.40–5.90)
RDW: 13.7 % (ref 11.5–14.5)
WBC: 18.6 10*3/uL — ABNORMAL HIGH (ref 3.8–10.6)

## 2017-02-27 LAB — URINE DRUG SCREEN, QUALITATIVE (ARMC ONLY)
Amphetamines, Ur Screen: POSITIVE — AB
BARBITURATES, UR SCREEN: NOT DETECTED
BENZODIAZEPINE, UR SCRN: NOT DETECTED
CANNABINOID 50 NG, UR ~~LOC~~: POSITIVE — AB
COCAINE METABOLITE, UR ~~LOC~~: POSITIVE — AB
MDMA (Ecstasy)Ur Screen: NOT DETECTED
METHADONE SCREEN, URINE: NOT DETECTED
Opiate, Ur Screen: POSITIVE — AB
Phencyclidine (PCP) Ur S: NOT DETECTED
TRICYCLIC, UR SCREEN: NOT DETECTED

## 2017-02-27 MED ORDER — POTASSIUM CHLORIDE CRYS ER 20 MEQ PO TBCR
40.0000 meq | EXTENDED_RELEASE_TABLET | ORAL | Status: AC
  Administered 2017-02-27 (×2): 40 meq via ORAL
  Filled 2017-02-27 (×2): qty 2

## 2017-02-27 NOTE — Plan of Care (Signed)
Pt progressing. Pain has been manageable

## 2017-02-27 NOTE — Progress Notes (Signed)
[facility] - [place] at [facility]   PATIENT NAME: Leon FickRaymond Palmeri    MR#:  [id number]  DATE OF BIRTH:  03-Apr-1980  SUBJECTIVE:  CHIEF COMPLAINT:   Chief Complaint  Patient presents with  . Foot Pain   -Still with significant left foot swelling and redness and tenderness. -On IV antibiotics. We'll continue for today  REVIEW OF SYSTEMS:  Review of Systems  Constitutional: Negative for chills, fever and malaise/fatigue.  HENT: Negative for congestion, ear discharge, hearing loss and nosebleeds.   Eyes: Negative for blurred vision and double vision.  Respiratory: Negative for cough, shortness of breath and wheezing.   Cardiovascular: Positive for leg swelling. Negative for chest pain and palpitations.  Gastrointestinal: Negative for abdominal pain, constipation, diarrhea, nausea and vomiting.  Genitourinary: Negative for dysuria.  Musculoskeletal: Positive for myalgias.  Neurological: Negative for dizziness, speech change, focal weakness, seizures and headaches.  Psychiatric/Behavioral: Negative for depression.    DRUG ALLERGIES:  No Known Allergies  VITALS:  Blood pressure (!) 95/57, pulse (!) 110, temperature 99.2 F (37.3 C), temperature source Oral, resp. rate 18, height 5\' 11"  (1.803 m), weight 70.9 kg (156 lb 3.2 oz), SpO2 99 %.  PHYSICAL EXAMINATION:  Physical Exam  GENERAL:  36 y.o.-year-old patient lying in the bed with no acute distress.  EYES: Pupils equal, round, reactive to light and accommodation. No scleral icterus. Extraocular muscles intact.  HEENT: Head atraumatic, normocephalic. Oropharynx and nasopharynx clear.  NECK:  Supple, no jugular venous distention. No thyroid enlargement, no tenderness.  LUNGS: Normal breath sounds bilaterally, no wheezing, rales,rhonchi or crepitation. No use of accessory muscles of respiration.  CARDIOVASCULAR: S1, S2 normal. No murmurs, rubs, or gallops.  ABDOMEN: Soft, nontender, nondistended. Bowel sounds  present. No organomegaly or mass.  EXTREMITIES: No cyanosis, or clubbing. Left foot with significant erythema and swelling on the foot and above the ankle region spreading onto the anterior leg. Also significant tenderness and warmth to touch. NEUROLOGIC: Cranial nerves II through XII are intact. Muscle strength 5/5 in all extremities. Sensation intact. Gait not checked.  PSYCHIATRIC: The patient is alert and oriented x 3.  SKIN: No obvious rash, lesion, or ulcer.    LABORATORY PANEL:   CBC Recent Labs  Lab 02/27/17 0320  WBC 18.6*  HGB 13.3  HCT 39.6*  PLT 190   ------------------------------------------------------------------------------------------------------------------  Chemistries  Recent Labs  Lab 02/27/17 0320  NA 135  K 3.3*  CL 104  CO2 21*  GLUCOSE 130*  BUN 13  CREATININE 1.13  CALCIUM 8.7*   ------------------------------------------------------------------------------------------------------------------  Cardiac Enzymes No results for input(s): TROPONINI in the last 168 hours. ------------------------------------------------------------------------------------------------------------------  RADIOLOGY:  No results found.  EKG:  No orders found for this or any previous visit.  ASSESSMENT AND PLAN:   36 year old male with past medical history significant for IV drug abuse admitted to the hospital secondary to left foot swelling and tenderness.  #1 left foot cellulitis-extending onto the leg -Follow blood cultures. Echocardiogram to rule out vegetations -Continue vancomycin and Unasyn -Continue IV antibiotics for now. -If the swelling on the calf and the leg does not improve by tomorrow, we'll get Dopplers to rule out DVT.  #2 sinus tachycardia-check urine tox screen to see if he has done any other drugs. Continue IV fluids  #3 leukocytosis-secondary to above. Continue IV antibiotics related lactic acid is normal  #4 DVT  prophylaxis-Lovenox  #4 hypokalemia-being replaced     All the records are reviewed and case discussed with  Care Management/Social Workerr. Management plans discussed with the patient, family and they are in agreement.  CODE STATUS: Full code  TOTAL TIME TAKING CARE OF THIS PATIENT: 38 minutes.   POSSIBLE D/C IN 1-2 DAYS, DEPENDING ON CLINICAL CONDITION.   Enid BaasKALISETTI,Lovada Barwick M.D on 02/27/2017 at 11:11 AM  Between 7am to 6pm - Pager - [phone]  After 6pm go to [email or web address] - Social research officer, governmentpassword EPAS [facility]  [facility]  Office  [phone]  CC: Primary care physician; [facility], [employer]

## 2017-02-28 ENCOUNTER — Inpatient Hospital Stay: Payer: Self-pay

## 2017-02-28 ENCOUNTER — Inpatient Hospital Stay (HOSPITAL_COMMUNITY)
Admit: 2017-02-28 | Discharge: 2017-02-28 | Disposition: A | Discharge status: Home / Self Care | Payer: Self-pay | Attending: Internal Medicine | Admitting: Internal Medicine

## 2017-02-28 DIAGNOSIS — R7881 Bacteremia: Secondary | ICD-10-CM

## 2017-02-28 LAB — BASIC METABOLIC PANEL
Anion gap: 7 (ref 5–15)
BUN: 14 mg/dL (ref 6–20)
CO2: 22 mmol/L (ref 22–32)
CREATININE: 0.89 mg/dL (ref 0.61–1.24)
Calcium: 8.4 mg/dL — ABNORMAL LOW (ref 8.9–10.3)
Chloride: 108 mmol/L (ref 101–111)
GFR calc Af Amer: >60 mL/min (ref >60)
GLUCOSE: 118 mg/dL — AB (ref 65–99)
POTASSIUM: 3.6 mmol/L (ref 3.5–5.1)
SODIUM: 137 mmol/L (ref 135–145)

## 2017-02-28 LAB — ECHOCARDIOGRAM COMPLETE
HEIGHTINCHES: 71 in
WEIGHTICAEL: 2499.2 [oz_av]

## 2017-02-28 LAB — CBC
HCT: 37.6 % — ABNORMAL LOW (ref 40.0–52.0)
Hemoglobin: 12.6 g/dL — ABNORMAL LOW (ref 13.0–18.0)
MCH: 27.5 pg (ref 26.0–34.0)
MCHC: 33.6 g/dL (ref 32.0–36.0)
MCV: 81.7 fL (ref 80.0–100.0)
PLATELETS: 208 10*3/uL (ref 150–440)
RBC: 4.6 MIL/uL (ref 4.40–5.90)
RDW: 13.8 % (ref 11.5–14.5)
WBC: 17.3 10*3/uL — ABNORMAL HIGH (ref 3.8–10.6)

## 2017-02-28 LAB — VANCOMYCIN, TROUGH: Vancomycin Tr: 8 ug/mL — ABNORMAL LOW (ref 15–20)

## 2017-02-28 LAB — HIV ANTIBODY (ROUTINE TESTING W REFLEX): HIV SCREEN 4TH GENERATION: NONREACTIVE

## 2017-02-28 MED ORDER — VANCOMYCIN HCL 10 G IV SOLR
1500.0000 mg | Freq: Two times a day (BID) | INTRAVENOUS | Status: DC
  Administered 2017-03-01: 1500 mg via INTRAVENOUS
  Filled 2017-02-28 (×3): qty 1500

## 2017-02-28 MED ORDER — SODIUM CHLORIDE 0.9 % IV SOLN
1500.0000 mg | Freq: Once | INTRAVENOUS | Status: AC
  Administered 2017-02-28: 1500 mg via INTRAVENOUS
  Filled 2017-02-28: qty 1500

## 2017-02-28 NOTE — Progress Notes (Signed)
Pt noted that the pain in his foot was quite bad last night and when he hung his foot off the bed it felt like it was going to explode.

## 2017-02-28 NOTE — Care Management Note (Signed)
Case Management Note  Patient Details  Name: Leon Riley MRN: [id number] Date of Birth: 1981-02-21  Subjective/Objective:  Admitted to [facility] with the diagnosis of cellulitis  of left leg.    Lives with girlfriend Leon Riley [phone]). Mother is Leon Riley [phone]). The last physician he seen was Dr. Loma Senderharles [place] in [place]. No insurance. Last work was about a year ago at [name]. [place] in [place]                Action/Plan: [facility] Door and Medication Management application given.   Expected Discharge Date:  02/28/17               Expected Discharge Plan:     In-House Referral:     Discharge planning Services     Post Acute Care Choice:    Choice offered to:     DME Arranged:    DME Agency:     HH Arranged:    HH Agency:     Status of Service:     If discussed at [employer] Meetings, dates discussed:    Additional Comments:  Gwenette GreetBrenda S Odaly Peri, RN MSN [facility] [phone] 02/28/2017, 10:44 AM

## 2017-02-28 NOTE — Progress Notes (Signed)
[facility] - [place] at [facility]   PATIENT NAME: Georgianne FickRaymond Cardiff    MR#:  [id number]  DATE OF BIRTH:  [birth year]  SUBJECTIVE:  CHIEF COMPLAINT:   Chief Complaint  Patient presents with  . Foot Pain   - Slowly improving left foot cellulitis. Redness is better but still swelling presenting complaints of significant pain today.  REVIEW OF SYSTEMS:  Review of Systems  Constitutional: Negative for chills, fever and malaise/fatigue.  HENT: Negative for congestion, ear discharge, hearing loss and nosebleeds.   Eyes: Negative for blurred vision and double vision.  Respiratory: Negative for cough, shortness of breath and wheezing.   Cardiovascular: Positive for leg swelling. Negative for chest pain and palpitations.  Gastrointestinal: Negative for abdominal pain, constipation, diarrhea, nausea and vomiting.  Genitourinary: Negative for dysuria.  Musculoskeletal: Positive for myalgias.  Neurological: Negative for dizziness, speech change, focal weakness, seizures and headaches.  Psychiatric/Behavioral: Negative for depression.    DRUG ALLERGIES:  No Known Allergies  VITALS:  Blood pressure 120/80, pulse 96, temperature 98.9 F (37.2 C), temperature source Oral, resp. rate 16, height 5\' 11"  (1.803 m), weight 70.9 kg (156 lb 3.2 oz), SpO2 98 %.  PHYSICAL EXAMINATION:  Physical Exam  GENERAL:  36 y.o.-year-old patient lying in the bed with no acute distress.  EYES: Pupils equal, round, reactive to light and accommodation. No scleral icterus. Extraocular muscles intact.  HEENT: Head atraumatic, normocephalic. Oropharynx and nasopharynx clear.  NECK:  Supple, no jugular venous distention. No thyroid enlargement, no tenderness.  LUNGS: Normal breath sounds bilaterally, no wheezing, rales,rhonchi or crepitation. No use of accessory muscles of respiration.  CARDIOVASCULAR: S1, S2 normal. No murmurs, rubs, or gallops.  ABDOMEN: Soft, nontender, nondistended. Bowel  sounds present. No organomegaly or mass.  EXTREMITIES: No cyanosis, or clubbing. Left foot with improving erythema and but still has swelling on the foot and above the ankle region spreading onto the anterior leg. Also significant tenderness and warmth to touch. NEUROLOGIC: Cranial nerves II through XII are intact. Muscle strength 5/5 in all extremities. Sensation intact. Gait not checked.  PSYCHIATRIC: The patient is alert and oriented x 3.  SKIN: No obvious rash, lesion, or ulcer.    LABORATORY PANEL:   CBC Recent Labs  Lab 02/27/17 0320  WBC 18.6*  HGB 13.3  HCT 39.6*  PLT 190   ------------------------------------------------------------------------------------------------------------------  Chemistries  Recent Labs  Lab 02/27/17 0320  NA 135  K 3.3*  CL 104  CO2 21*  GLUCOSE 130*  BUN 13  CREATININE 1.13  CALCIUM 8.7*   ------------------------------------------------------------------------------------------------------------------  Cardiac Enzymes No results for input(s): TROPONINI in the last 168 hours. ------------------------------------------------------------------------------------------------------------------  RADIOLOGY:  No results found.  EKG:  No orders found for this or any previous visit.  ASSESSMENT AND PLAN:   36 year old male with past medical history significant for IV drug abuse admitted to the hospital secondary to left foot swelling and tenderness.  #1 left foot cellulitis-extending onto the leg -negative blood cultures. Echocardiogram to rule out vegetations -on vancomycin and Unasyn -Continue IV antibiotics for now. -Ends pain and swelling or persistent, we'll get Dopplers to rule out DVT. Continue 1 more day of IV antibiotics and possible discharge tomorrow  #2 sinus tachycardia- DC IV fluids today. Heart rate is improving. Also secondary to pain -Urine tox positive for marijuana, cocaine and opioids   #3 leukocytosis-secondary  to above. Continue IV antibiotics , lactic acid is normal  #4 DVT prophylaxis-Lovenox  #4 hypokalemia-replaced- f/u labs today  All the records are reviewed and case discussed with Care Management/Social Workerr. Management plans discussed with the patient, family and they are in agreement.  CODE STATUS: Full code  TOTAL TIME TAKING CARE OF THIS PATIENT: 36 minutes.   POSSIBLE D/C TOMORROW, DEPENDING ON CLINICAL CONDITION.   Enid BaasKALISETTI,Hargis Vandyne M.D on 02/28/2017 at 10:06 AM  Between 7am to 6pm - Pager - [phone]  After 6pm go to [email or web address] - Social research officer, governmentpassword EPAS [facility]  [facility] [facility]  Office  [phone]  CC: Primary care physician; [facility], [employer]

## 2017-02-28 NOTE — Progress Notes (Signed)
*  PRELIMINARY RESULTS* Echocardiogram 2D Echocardiogram has been performed.  Cristela BlueHege, Elizebeth Kluesner 02/28/2017, 9:46 AM

## 2017-02-28 NOTE — Progress Notes (Signed)
ANTIBIOTIC CONSULT NOTE - INITIAL  Pharmacy Consult for Unasyn and vancomycin Indication: Cellulitis (per EDP "exam not consistent with an abscess")  No Known Allergies  Patient Measurements: Height: 5\' 11"  (180.3 cm) Weight: 156 lb 3.2 oz (70.9 kg) IBW/kg (Calculated) : 75.3 Adjusted Body Weight:   Vital Signs: Temp: 98.1 F (36.7 C) (12/03 1236) Temp Source: Oral (12/03 1236) BP: 115/69 (12/03 1236) Pulse Rate: 77 (12/03 1236) Intake/Output from previous day: 12/02 0701 - 12/03 0700 In: 3678.3 [P.O.:240; I.V.:2638.3; IV Piggyback:800] Out: 1725 [Urine:1725] Intake/Output from this shift: Total I/O In: 480 [P.O.:480] Out: 1150 [Urine:1150]  Labs: Recent Labs    02/26/17 1807 02/27/17 0320 02/28/17 1127  WBC 18.9* 18.6* 17.3*  HGB 14.4 13.3 12.6*  PLT 197 190 208  CREATININE 1.00 1.13 0.89   Estimated Creatinine Clearance: 116.2 mL/min (by C-G formula based on SCr of 0.89 mg/dL). Recent Labs    02/28/17 1127  VANCOTROUGH 8*     Microbiology: Recent Results (from the past 720 hour(s))  Blood culture (routine x 2)     Status: None (Preliminary result)   Collection Time: 02/26/17  6:09 PM  Result Value Ref Range Status   Specimen Description BLOOD BLOOD LEFT ARM  Final   Special Requests   Final    BOTTLES DRAWN AEROBIC AND ANAEROBIC Blood Culture adequate volume   Culture NO GROWTH 2 DAYS  Final   Report Status PENDING  Incomplete  Blood culture (routine x 2)     Status: None (Preliminary result)   Collection Time: 02/26/17  6:09 PM  Result Value Ref Range Status   Specimen Description BLOOD BLOOD RIGHT ARM  Final   Special Requests   Final    BOTTLES DRAWN AEROBIC AND ANAEROBIC Blood Culture adequate volume   Culture NO GROWTH 2 DAYS  Final   Report Status PENDING  Incomplete    Medical History: Past Medical History:  Diagnosis Date  . IV drug abuse (HCC)   . Spontaneous pneumothorax     Medications:  Infusions:  . ampicillin-sulbactam  (UNASYN) IV Stopped (02/28/17 0940)  . vancomycin    . [START ON 03/01/2017] vancomycin     Assessment: 36 yom cc foot pain, PMH includes abscesses and skin infections, IVDA, spontaneous pneumothorax.   Goal of Therapy:  Vancomycin trough level 10-15 mcg/ml  Resolve infection Prevent ADE  Plan:  Expected duration 7 days with resolution of temperature and/or normalization of WBC  1. Unasyn 3 gm IV Q6H 2. Vancomycin: Vancomycin trough level resulted @ 8. Will increase Vancomycin 1500 mg IV q12 hours. Will recheck Vancomycin level prior to the 5th dose of regimen if patient is still inpatient.   Demetrius Charityeldrin D. Sundai Probert, PharmD  Clinical Pharmacist 02/28/2017,1:12 PM

## 2017-03-01 MED ORDER — SULFAMETHOXAZOLE-TRIMETHOPRIM 800-160 MG PO TABS: 1.0000 | ORAL_TABLET | Freq: Two times a day (BID) | ORAL | 0 refills | Status: DC

## 2017-03-01 MED ORDER — HYDROCODONE-ACETAMINOPHEN 5-325 MG PO TABS: 1.0000 | ORAL_TABLET | Freq: Four times a day (QID) | ORAL | 0 refills | Status: DC | PRN

## 2017-03-01 NOTE — Progress Notes (Signed)
Reviewed discharge instructions including medications and follow up appointment. Patient verbalized understanding. Father here to pick up patient. Discharged home. ELQ
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
## 2017-03-01 NOTE — Discharge Summary (Signed)
[facility] [place] at [facility]   PATIENT NAME: Leon Riley    MR#:  [id number]  DATE OF BIRTH:  September 27, 1980  DATE OF ADMISSION:  02/26/2017   ADMITTING PHYSICIAN: Auburn BilberryShreyang Patel, MD  DATE OF DISCHARGE: 03/01/17  PRIMARY CARE PHYSICIAN: [facility]   ADMISSION DIAGNOSIS:   Substance abuse (HCC) [F19.10] Cellulitis, unspecified cellulitis site [L03.90]  DISCHARGE DIAGNOSIS:   Active Problems:   Cellulitis of left foot   SECONDARY DIAGNOSIS:   Past Medical History:  Diagnosis Date  . IV drug abuse (HCC)   . Spontaneous pneumothorax     HOSPITAL COURSE:   36 year old male with past medical history significant for IV drug abuse admitted to the hospital secondary to left foot swelling and tenderness.  #1 left foot cellulitis-extending onto the leg-secondary to IV drug abuse -negative blood cultures. Echocardiogram negative for any vegetations -on vancomycin and Unasyn in the hospital. Will be discharged on Bactrim -Significant clinical response noted. Doppler of the leg negative for any underlying DVT -Advised to keep the leg elevated for now  #2 sinus tachycardia-  secondary to pain and dehydration. Improved with IV fluids-Urine tox positive for marijuana, cocaine and opioids   #3 leukocytosis-secondary to above. lactic acid is normal  #4 IV drug abuse-counseled. Might revert back  Will be discharged today as cellulitis improved significantly    DISCHARGE CONDITIONS:   Guarded  CONSULTS OBTAINED:   None  DRUG ALLERGIES:   No Known Allergies DISCHARGE MEDICATIONS:   Allergies as of 03/01/2017   No Known Allergies     Medication List    STOP taking these medications   ibuprofen 600 MG tablet Commonly known as:  ADVIL,MOTRIN   oxyCODONE-acetaminophen 7.5-325 MG tablet Commonly known as:  PERCOCET   traMADol 50 MG tablet Commonly known as:  ULTRAM     TAKE these medications   HYDROcodone-acetaminophen  5-325 MG tablet Commonly known as:  NORCO/VICODIN Take 1-2 tablets by mouth every 6 (six) hours as needed for moderate pain or severe pain.   Naloxone HCl 0.4 MG/0.4ML Soaj Inject 0.4 mg as directed once as needed (heroin overdose). Inject 1 mL intramuscularly upon signs of opioid overdose. May repeat X 1. Call 911.   sulfamethoxazole-trimethoprim 800-160 MG tablet Commonly known as:  BACTRIM DS,SEPTRA DS Take 1 tablet by mouth 2 (two) times daily. X 10 days What changed:  additional instructions        DISCHARGE INSTRUCTIONS:   1. PCP follow in 1 week  DIET:   Regular diet  ACTIVITY:   Activity as tolerated  OXYGEN:   Home Oxygen: No.  Oxygen Delivery: room air  DISCHARGE LOCATION:   home   If you experience worsening of your admission symptoms, develop shortness of breath, life threatening emergency, suicidal or homicidal thoughts you must seek medical attention immediately by calling 911 or calling your MD immediately  if symptoms less severe.  You Must read complete instructions/literature along with all the possible adverse reactions/side effects for all the Medicines you take and that have been prescribed to you. Take any new Medicines after you have completely understood and accpet all the possible adverse reactions/side effects.   Please note  You were cared for by a hospitalist during your hospital stay. If you have any questions about your discharge medications or the care you received while you were in the hospital after you are discharged, you can call the unit and asked to speak with the hospitalist on call if the  hospitalist that took care of you is not available. Once you are discharged, your primary care physician will handle any further medical issues. Please note that NO REFILLS for any discharge medications will be authorized once you are discharged, as it is imperative that you return to your primary care physician (or establish a relationship with a  primary care physician if you do not have one) for your aftercare needs so that they can reassess your need for medications and monitor your lab values.    On the day of Discharge:  VITAL SIGNS:   Blood pressure 123/81, pulse 80, temperature 98.4 F (36.9 C), temperature source Oral, resp. rate 18, height 5\' 11"  (1.803 m), weight 70.9 kg (156 lb 3.2 oz), SpO2 98 %.  PHYSICAL EXAMINATION:     GENERAL:  36 y.o.-year-old patient lying in the bed with no acute distress.  EYES: Pupils equal, round, reactive to light and accommodation. No scleral icterus. Extraocular muscles intact.  HEENT: Head atraumatic, normocephalic. Oropharynx and nasopharynx clear.  NECK:  Supple, no jugular venous distention. No thyroid enlargement, no tenderness.  LUNGS: Normal breath sounds bilaterally, no wheezing, rales,rhonchi or crepitation. No use of accessory muscles of respiration.  CARDIOVASCULAR: S1, S2 normal. No murmurs, rubs, or gallops.  ABDOMEN: Soft, nontender, nondistended. Bowel sounds present. No organomegaly or mass.  EXTREMITIES: No cyanosis, or clubbing. Left foot with much improved erythema and improved swelling on the foot and above the ankle region. minimal tenderness and warmth to touch. NEUROLOGIC: Cranial nerves II through XII are intact. Muscle strength 5/5 in all extremities. Sensation intact. Gait not checked.  PSYCHIATRIC: The patient is alert and oriented x 3.  SKIN: No obvious rash, lesion, or ulcer.    DATA REVIEW:   CBC Recent Labs  Lab 02/28/17 1127  WBC 17.3*  HGB 12.6*  HCT 37.6*  PLT 208    Chemistries  Recent Labs  Lab 02/28/17 1127  NA 137  K 3.6  CL 108  CO2 22  GLUCOSE 118*  BUN 14  CREATININE 0.89  CALCIUM 8.4*     Microbiology Results  Results for orders placed or performed during the hospital encounter of 02/26/17  Blood culture (routine x 2)     Status: None (Preliminary result)   Collection Time: 02/26/17  6:09 PM  Result Value Ref Range  Status   Specimen Description BLOOD BLOOD LEFT ARM  Final   Special Requests   Final    BOTTLES DRAWN AEROBIC AND ANAEROBIC Blood Culture adequate volume   Culture NO GROWTH 2 DAYS  Final   Report Status PENDING  Incomplete  Blood culture (routine x 2)     Status: None (Preliminary result)   Collection Time: 02/26/17  6:09 PM  Result Value Ref Range Status   Specimen Description BLOOD BLOOD RIGHT ARM  Final   Special Requests   Final    BOTTLES DRAWN AEROBIC AND ANAEROBIC Blood Culture adequate volume   Culture NO GROWTH 2 DAYS  Final   Report Status PENDING  Incomplete    RADIOLOGY:  US Venous Img Lower Unilateral Left  Result Date: 02/28/2017 CLINICAL DATA:  36 year old male with a history of leg swelling EXAM: LEFT LOWER EXTREMITY VENOUS DOPPLER ULTRASOUND TECHNIQUE: Gray-scale sonography with graded compression, as well as color Doppler and duplex ultrasound were performed to evaluate the lower extremity deep venous systems from the level of the common femoral vein and including the common femoral, femoral, profunda femoral, popliteal and calf veins including the posterior  tibial, peroneal and gastrocnemius veins when visible. The superficial great saphenous vein was also interrogated. Spectral Doppler was utilized to evaluate flow at rest and with distal augmentation maneuvers in the common femoral, femoral and popliteal veins. COMPARISON:  None. FINDINGS: Contralateral Common Femoral Vein: Respiratory phasicity is normal and symmetric with the symptomatic side. No evidence of thrombus. Normal compressibility. Common Femoral Vein: No evidence of thrombus. Normal compressibility, respiratory phasicity and response to augmentation. Saphenofemoral Junction: No evidence of thrombus. Normal compressibility and flow on color Doppler imaging. Profunda Femoral Vein: No evidence of thrombus. Normal compressibility and flow on color Doppler imaging. Femoral Vein: No evidence of thrombus. Normal  compressibility, respiratory phasicity and response to augmentation. Popliteal Vein: No evidence of thrombus. Normal compressibility, respiratory phasicity and response to augmentation. Calf Veins: No evidence of thrombus. Normal compressibility and flow on color Doppler imaging. Superficial Great Saphenous Vein: No evidence of thrombus. Normal compressibility and flow on color Doppler imaging. Other Findings:  Potentially reactive inguinal lymph nodes. IMPRESSION: Sonographic survey of the left lower extremity negative for DVT Electronically Signed   By: Gilmer MorJaime  Wagner D.O.   On: 02/28/2017 15:26     Management plans discussed with the patient, family and they are in agreement.  CODE STATUS:     Code Status Orders  (From admission, onward)        Start     Ordered   02/26/17 2024  Full code  Continuous     02/26/17 2023    Code Status History    Date Active Date Inactive Code Status Order ID Comments User Context   This patient has a current code status but no historical code status.      TOTAL TIME TAKING CARE OF THIS PATIENT: 37 minutes.    Enid BaasKALISETTI,Chaya Dehaan M.D on 03/01/2017 at 8:51 AM  Between 7am to 6pm - Pager - [phone]  After 6pm go to [email or web address] - Social research officer, governmentpassword EPAS [facility]  [facility]  Office  [phone]  CC: Primary care physician; [facility]   Note: This dictation was prepared with Dragon dictation along with smaller phrase technology. Any transcriptional errors that result from this process are unintentional.

## 2017-03-03 LAB — CULTURE, BLOOD (ROUTINE X 2)
Culture: NO GROWTH
Culture: NO GROWTH
SPECIAL REQUESTS: ADEQUATE
Special Requests: ADEQUATE

## 2018-06-28 IMAGING — US US EXTREM LOW VENOUS*L*
1 series · 13 of 24 positions shown · non-contrast
Comparison: None.

CLINICAL DATA: 36-year-old male with a history of leg swelling



[Series 1: us extrem low venous*left* · 0.07mm/px · 13 of 39 slices shown]
[im 1/39]
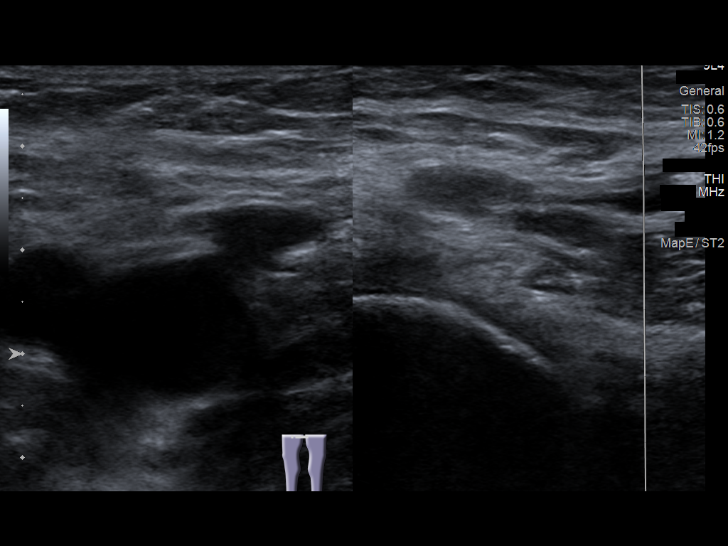
[im 4/39]
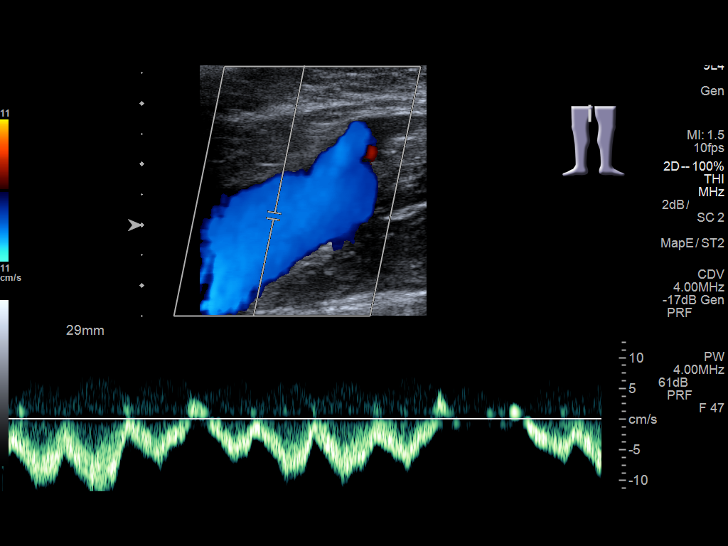
[im 7/39]
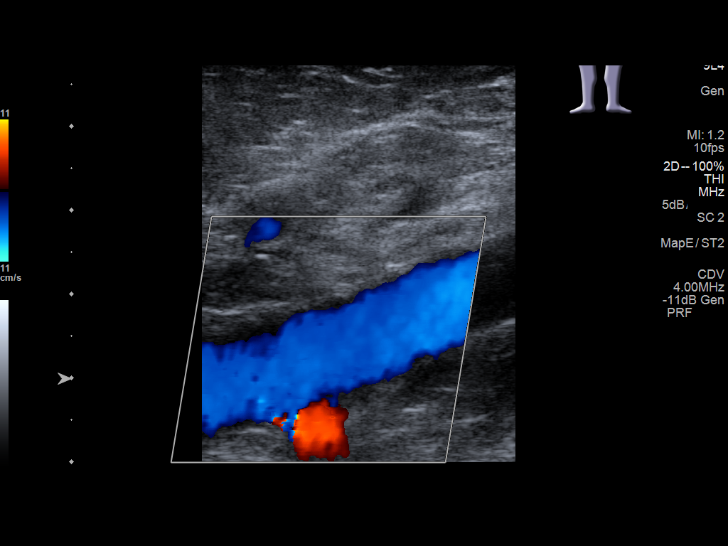
[im 10/39]
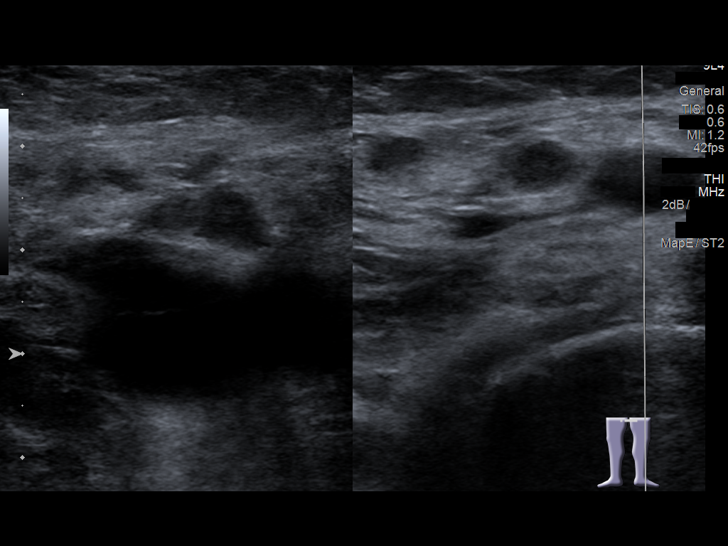
[im 14/39]
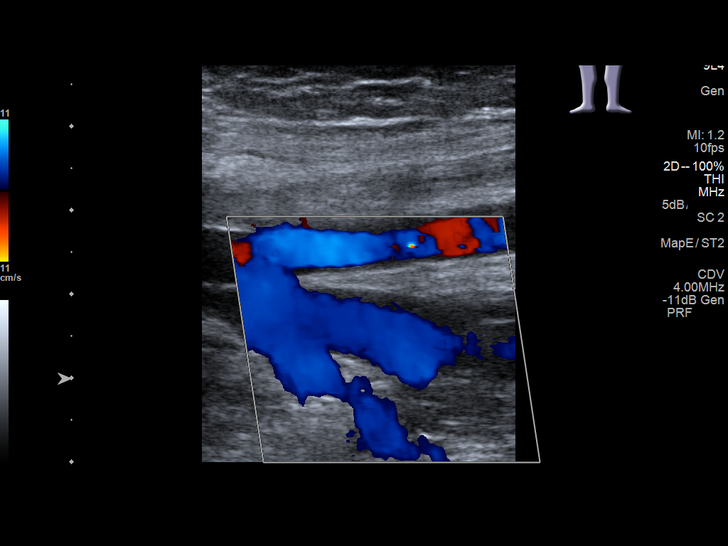
[im 17/39]
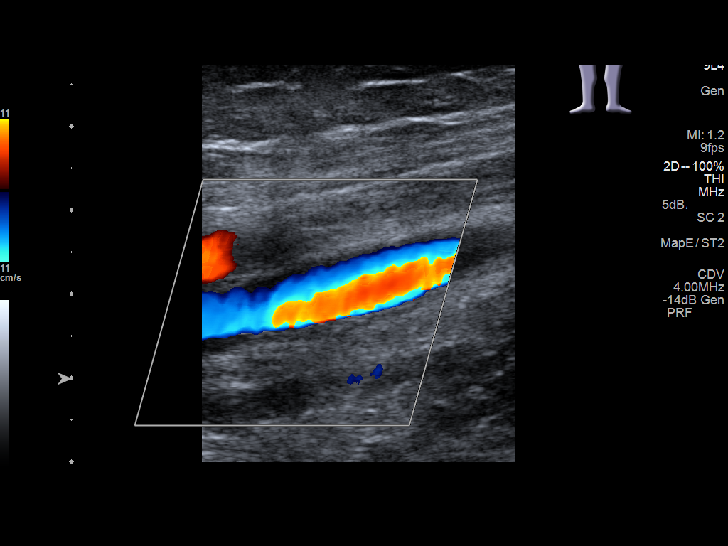
[im 20/39]
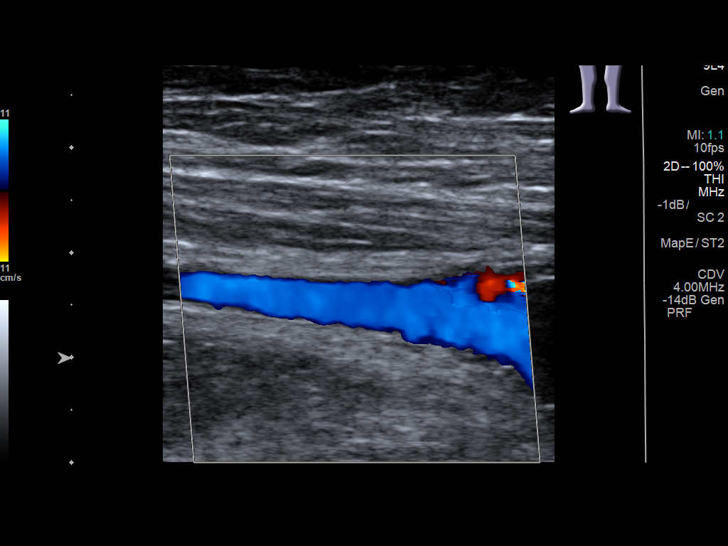
[im 22/39]
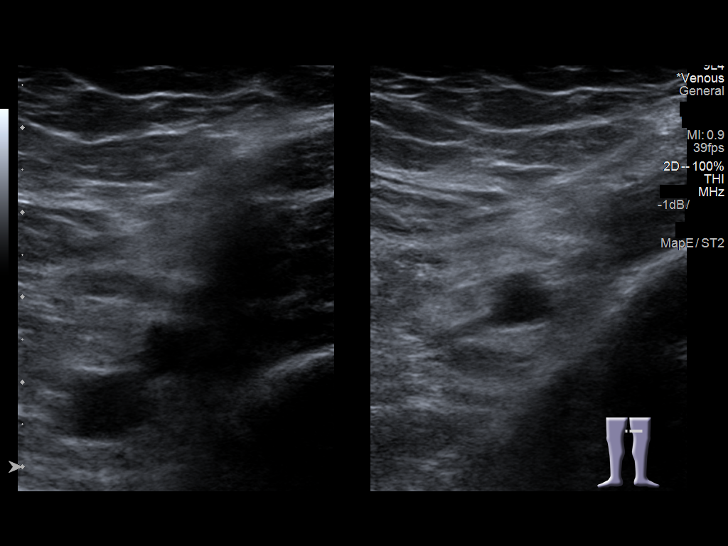
[im 25/39]
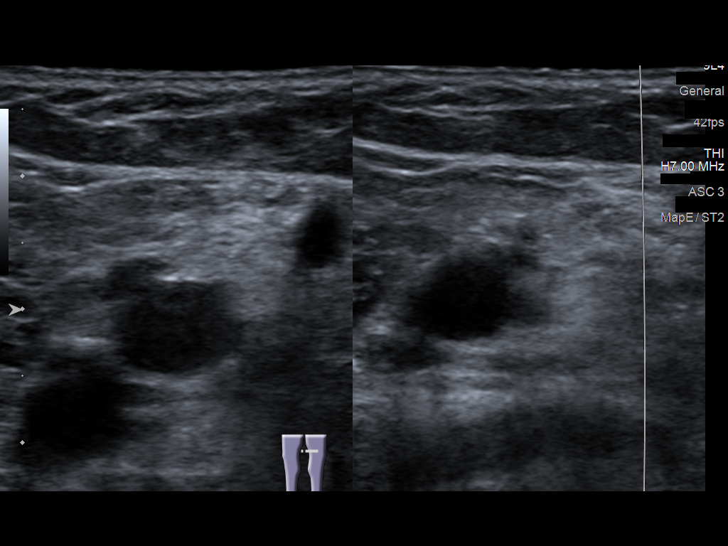
[im 29/39]
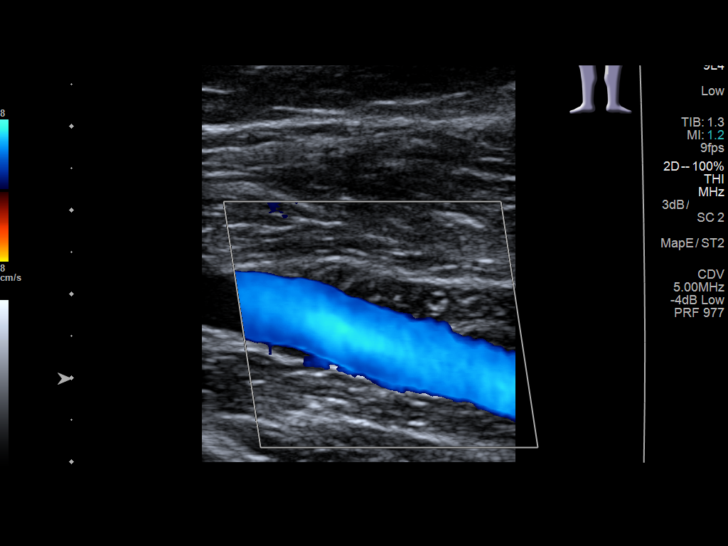
[im 32/39]
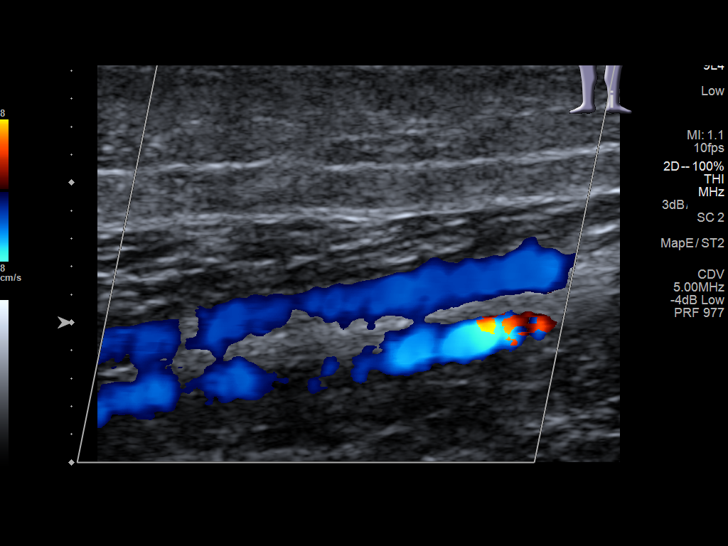
[im 35/39]
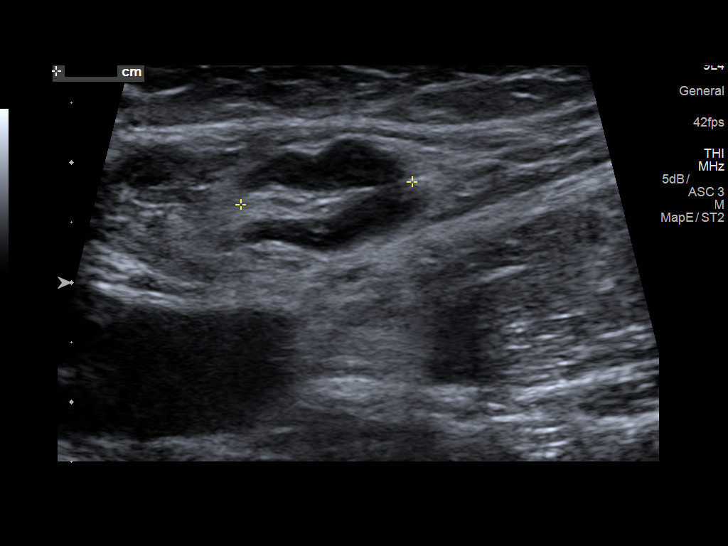
[im 39/39]
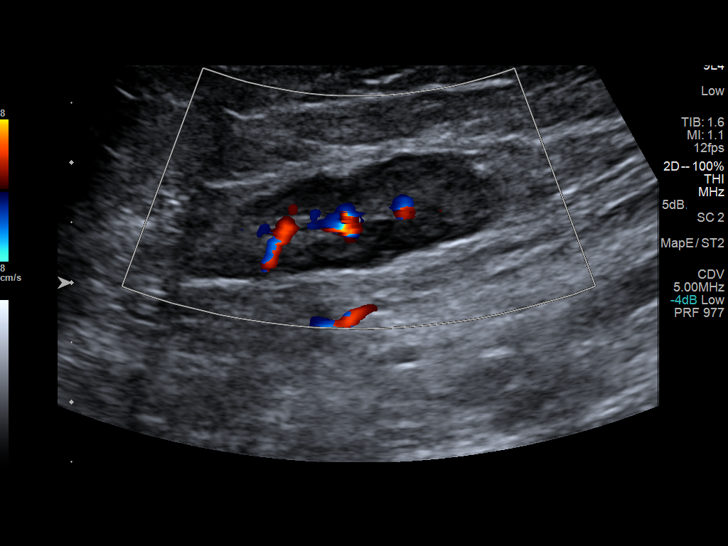

[13 of 24 positions shown; findings below may reference images not displayed]

FINDINGS: Contralateral Common Femoral Vein: Respiratory phasicity is normal
and symmetric with the symptomatic side. No evidence of thrombus.
Normal compressibility.

Common Femoral Vein: No evidence of thrombus. Normal
compressibility, respiratory phasicity and response to augmentation.

Saphenofemoral Junction: No evidence of thrombus. Normal
compressibility and flow on color Doppler imaging.

Profunda Femoral Vein: No evidence of thrombus. Normal
compressibility and flow on color Doppler imaging.

Femoral Vein: No evidence of thrombus. Normal compressibility,
respiratory phasicity and response to augmentation.

Popliteal Vein: No evidence of thrombus. Normal compressibility,
respiratory phasicity and response to augmentation.

Calf Veins: No evidence of thrombus. Normal compressibility and flow
on color Doppler imaging.

Superficial Great Saphenous Vein: No evidence of thrombus. Normal
compressibility and flow on color Doppler imaging.

Other Findings:  Potentially reactive inguinal lymph nodes.
IMPRESSION: Sonographic survey of the left lower extremity negative for DVT

## 2019-03-05 ENCOUNTER — Other Ambulatory Visit: Payer: Self-pay

## 2019-03-05 ENCOUNTER — Emergency Department: Payer: Self-pay

## 2019-03-05 ENCOUNTER — Encounter: Payer: Self-pay | Admitting: Emergency Medicine

## 2019-03-05 ENCOUNTER — Inpatient Hospital Stay
Admission: EM | Admit: 2019-03-05 | Discharge: 2019-03-08 | DRG: 982 | Discharge status: Against Medical Advice | Payer: Self-pay | Attending: Internal Medicine | Admitting: Internal Medicine

## 2019-03-05 DIAGNOSIS — Z720 Tobacco use: Secondary | ICD-10-CM

## 2019-03-05 DIAGNOSIS — L02434 Carbuncle of left upper limb: Secondary | ICD-10-CM | POA: Diagnosis present

## 2019-03-05 DIAGNOSIS — L02414 Cutaneous abscess of left upper limb: Principal | ICD-10-CM | POA: Diagnosis present

## 2019-03-05 DIAGNOSIS — F17290 Nicotine dependence, other tobacco product, uncomplicated: Secondary | ICD-10-CM | POA: Diagnosis present

## 2019-03-05 DIAGNOSIS — F1910 Other psychoactive substance abuse, uncomplicated: Secondary | ICD-10-CM

## 2019-03-05 DIAGNOSIS — Z5329 Procedure and treatment not carried out because of patient's decision for other reasons: Secondary | ICD-10-CM | POA: Diagnosis not present

## 2019-03-05 DIAGNOSIS — Z833 Family history of diabetes mellitus: Secondary | ICD-10-CM

## 2019-03-05 DIAGNOSIS — L03114 Cellulitis of left upper limb: Secondary | ICD-10-CM

## 2019-03-05 DIAGNOSIS — F1120 Opioid dependence, uncomplicated: Secondary | ICD-10-CM | POA: Diagnosis present

## 2019-03-05 DIAGNOSIS — Z20828 Contact with and (suspected) exposure to other viral communicable diseases: Secondary | ICD-10-CM | POA: Diagnosis present

## 2019-03-05 LAB — PROTIME-INR
INR: 1 (ref 0.8–1.2)
Prothrombin Time: 13.1 seconds (ref 11.4–15.2)

## 2019-03-05 LAB — CREATININE, SERUM
Creatinine, Ser: 0.96 mg/dL (ref 0.61–1.24)
GFR calc Af Amer: >60 mL/min (ref >60)
GFR calc non Af Amer: >60 mL/min (ref >60)

## 2019-03-05 LAB — APTT: aPTT: 33 seconds (ref 24–36)

## 2019-03-05 LAB — HIV ANTIBODY (ROUTINE TESTING W REFLEX): HIV Screen 4th Generation wRfx: NONREACTIVE

## 2019-03-05 MED ORDER — MORPHINE SULFATE (PF) 4 MG/ML IV SOLN
4.0000 mg | Freq: Once | INTRAVENOUS | Status: AC
  Administered 2019-03-05: 4 mg via INTRAVENOUS
  Filled 2019-03-05: qty 1

## 2019-03-05 MED ORDER — NICOTINE 21 MG/24HR TD PT24
21.0000 mg | MEDICATED_PATCH | Freq: Every day | TRANSDERMAL | Status: DC
  Administered 2019-03-06 – 2019-03-07 (×2): 21 mg via TRANSDERMAL
  Filled 2019-03-05 (×2): qty 1

## 2019-03-05 MED ORDER — NALOXONE HCL 0.4 MG/ML IJ SOLN
INTRAMUSCULAR | Status: AC
  Filled 2019-03-05: qty 1

## 2019-03-05 MED ORDER — VANCOMYCIN HCL IN DEXTROSE 1-5 GM/200ML-% IV SOLN
1000.0000 mg | Freq: Two times a day (BID) | INTRAVENOUS | Status: DC
  Administered 2019-03-06 – 2019-03-08 (×5): 1000 mg via INTRAVENOUS
  Filled 2019-03-05 (×8): qty 200

## 2019-03-05 MED ORDER — VANCOMYCIN HCL 1.5 G IV SOLR
1500.0000 mg | Freq: Once | INTRAVENOUS | Status: AC
  Administered 2019-03-05: 1500 mg via INTRAVENOUS
  Filled 2019-03-05: qty 1500

## 2019-03-05 MED ORDER — SODIUM CHLORIDE 0.9 % IV BOLUS
1000.0000 mL | Freq: Once | INTRAVENOUS | Status: AC
  Administered 2019-03-05: 1000 mL via INTRAVENOUS

## 2019-03-05 MED ORDER — SODIUM CHLORIDE 0.9 % IV SOLN
INTRAVENOUS | Status: DC
  Administered 2019-03-05 – 2019-03-07 (×2): via INTRAVENOUS

## 2019-03-05 MED ORDER — ACETAMINOPHEN 325 MG PO TABS: 650.0000 mg | ORAL_TABLET | Freq: Four times a day (QID) | ORAL | Status: DC | PRN

## 2019-03-05 MED ORDER — PIPERACILLIN-TAZOBACTAM 3.375 G IVPB
3.3750 g | Freq: Three times a day (TID) | INTRAVENOUS | Status: DC
  Administered 2019-03-05 – 2019-03-08 (×8): 3.375 g via INTRAVENOUS
  Filled 2019-03-05 (×10): qty 50

## 2019-03-05 MED ORDER — OXYCODONE-ACETAMINOPHEN 5-325 MG PO TABS
1.0000 | ORAL_TABLET | ORAL | Status: DC | PRN
  Administered 2019-03-05 – 2019-03-08 (×12): 1 via ORAL
  Filled 2019-03-05 (×12): qty 1

## 2019-03-05 MED ORDER — OXYCODONE-ACETAMINOPHEN 5-325 MG PO TABS
1.00 | ORAL_TABLET | ORAL | Status: DC
Start: ? — End: 2019-03-05

## 2019-03-05 MED ORDER — IBUPROFEN 400 MG PO TABS
400.0000 mg | ORAL_TABLET | Freq: Three times a day (TID) | ORAL | Status: DC
  Administered 2019-03-05 – 2019-03-06 (×3): 400 mg via ORAL
  Filled 2019-03-05 (×3): qty 1

## 2019-03-05 MED ORDER — SENNOSIDES-DOCUSATE SODIUM 8.6-50 MG PO TABS: 1.0000 | ORAL_TABLET | Freq: Every evening | ORAL | Status: DC | PRN

## 2019-03-05 MED ORDER — IOHEXOL 300 MG/ML  SOLN
100.0000 mL | Freq: Once | INTRAMUSCULAR | Status: AC | PRN
  Administered 2019-03-05: 100 mL via INTRAVENOUS
  Filled 2019-03-05: qty 100

## 2019-03-05 MED ORDER — KETOROLAC TROMETHAMINE 30 MG/ML IJ SOLN
15.0000 mg | Freq: Four times a day (QID) | INTRAMUSCULAR | Status: DC | PRN
  Administered 2019-03-05 – 2019-03-07 (×5): 15 mg via INTRAVENOUS
  Filled 2019-03-05 (×7): qty 1

## 2019-03-05 MED ORDER — ONDANSETRON HCL 4 MG/2ML IJ SOLN: 4.0000 mg | Freq: Three times a day (TID) | INTRAMUSCULAR | Status: DC | PRN

## 2019-03-05 NOTE — Progress Notes (Signed)
Pharmacy Antibiotic Note  Leon Riley is a 38 y.o. male admitted on 03/05/2019. Pharmacy has been consulted for vancomycin and Zosyn dosing for abscess/cellulitis. Patient with h/o IVDU. Plan for irrigation and debridement of the volar forearm abscess tomorrow by orthopedics.  Plan: Vancomcyin 1500 mg IV x 1 followed by vancomycin 1000 mg IV Q 12 hrs  Goal AUC 400-550 Expected AUC: 461 SCr used: 0.96   Height: 5\' 10"  (177.8 cm) Weight: 150 lb (68 kg) IBW/kg (Calculated) : 73  Temp (24hrs), Avg:98.7 F (37.1 C), Min:98 F (36.7 C), Max:99.5 F (37.5 C)  Recent Labs  Lab 03/05/19 1445  CREATININE 0.96    Estimated Creatinine Clearance: 101.3 mL/min (by C-G formula based on SCr of 0.96 mg/dL).    No Known Allergies  Antimicrobials this admission: Vancomycin 12/7 >> Zosyn 12/7 >>  Dose adjustments this admission: NA  Microbiology results: 12/7 BCx: pending   Thank you for allowing pharmacy to be a part of this patient's care.  Tawnya Crook, PharmD 03/05/2019 7:37 PM

## 2019-03-05 NOTE — ED Notes (Signed)
Fiance in room with pt.

## 2019-03-05 NOTE — ED Notes (Signed)
Pt given phone to call fiance to come in to be with him in room.

## 2019-03-05 NOTE — ED Notes (Signed)
Pt girlfriend/fiance is told to leave the property and notified she will not be able to return to hospital while pt is here.

## 2019-03-05 NOTE — ED Notes (Signed)
ED TO INPATIENT HANDOFF REPORT  ED Nurse Name and Phone #:  Dorothyann Gibbs [phone]  S Name/Age/Gender Leon Riley 38 y.o. male Room/Bed: ED37A/ED37A  Code Status   Code Status: Prior  Home/SNF/Other Home Patient oriented to: self, place, time and situation Is this baseline? Yes   Triage Complete: Triage complete  Chief Complaint cellulitis  Triage Note Pt arrives with IV in place. Pt states came from [facility] because he was there 12 hours and they did nothing.   Pt states he did received a dose of antibiotics but they would not give him pain medications so he left. Pt states [facility] gave him IV fentanyl but [facility] only gave him a tylenol 3.  Pt continuously asking for pain medication   Allergies No Known Allergies  Level of Care/Admitting Diagnosis ED Disposition    ED Disposition Condition [facility] Area: [place] [100120]  Level of Care: Med-Surg [16]  Covid Evaluation: Asymptomatic Screening Protocol (No Symptoms)  Diagnosis: Cellulitis of left arm [671245]  Admitting Physician: Ivor Costa [[id number]]  Attending Physician: Ivor Costa [[id number]]  Estimated length of stay: past midnight tomorrow  Certification:: I certify this patient will need inpatient services for at least 2 midnights  PT Class (Do Not Modify): Inpatient [101]  PT Acc Code (Do Not Modify): Private [1]       B Medical/Surgery History Past Medical History:  Diagnosis Date  . IV drug abuse ([place])   . Spontaneous pneumothorax    Past Surgical History:  Procedure Laterality Date  . CHEST TUBE INSERTION       A IV Location/Drains/Wounds Patient Lines/Drains/Airways Status   Active Line/Drains/Airways    Name:   Placement date:   Placement time:   Site:   Days:   Peripheral IV 03/05/19 Right Forearm   03/05/19    1128    Forearm   less than 1   Peripheral IV 03/05/19 Right Forearm   03/05/19    1406    Forearm   less than 1          Intake/Output Last 24  hours  Intake/Output Summary (Last 24 hours) at 03/05/2019 1751 Last data filed at 03/05/2019 1419 Gross per 24 hour  Intake -  Output 900 ml  Net -900 ml    Labs/Imaging Results for orders placed or performed during the hospital encounter of 03/05/19 (from the past 48 hour(s))  Today - PT/INR     Status: None   Collection Time: 03/05/19  2:45 PM  Result Value Ref Range   Prothrombin Time 13.1 11.4 - 15.2 seconds   INR 1.0 0.8 - 1.2    Comment: (NOTE) INR goal varies based on device and disease states. Performed at [facility], [address] [place], [place] [address]   APTT     Status: None   Collection Time: 03/05/19  2:45 PM  Result Value Ref Range   aPTT 33 24 - 36 seconds    Comment: Performed at [facility], [address] [place], [place] [address]  Creatinine, serum     Status: None   Collection Time: 03/05/19  2:45 PM  Result Value Ref Range   Creatinine, Ser 0.96 0.61 - 1.24 mg/dL   GFR calc non Af Amer >60 >60 mL/min   GFR calc Af Amer >60 >60 mL/min    Comment: Performed at [facility], [address] [place], [place] [address]   Ct Forearm Left  W Contrast  Result Date: 03/05/2019 CLINICAL DATA:  Edema in the left upper extremity. History of IV drug abuse. Suspect DVT or abscess. EXAM: CT OF THE UPPER LEFT EXTREMITY WITH CONTRAST TECHNIQUE: Multidetector CT imaging of the left forearm was performed according to the standard protocol following intravenous contrast administration. CONTRAST:  100mL OMNIPAQUE IOHEXOL 300 MG/ML  SOLN COMPARISON:  None recent. Radiographs 11/20/2015. FINDINGS: Bones/Joint/Cartilage No evidence of acute fracture, dislocation or bone destruction. The joint spaces appear maintained at the left elbow and left wrist. No significant joint effusions. Ligaments Suboptimally assessed by CT. Muscles and Tendons There is a large peripherally enhancing intramuscular fluid collection in the radial aspect of the distal  forearm, involving the flexor musculature. This measures up to 5.5 x 3.1 x 2.6 cm and is consistent with an abscess. No evidence of associated foreign body or soft tissue emphysema. No significant tenosynovitis. No other focal fluid collections are identified. Soft tissues There is diffuse subcutaneous edema throughout the forearm, extending into the dorsal aspect of the hand. No definite deep or superficial venous thrombosis identified. There are prominent lymph nodes in the antecubital fossa, likely reactive. IMPRESSION: 1. Large intramuscular abscess in the distal forearm, involving the flexor musculature. 2. No evidence of associated foreign body or soft tissue emphysema. 3. No evidence of osteomyelitis or septic joint. 4. Prominent lymph nodes in the antecubital fossa, likely reactive. Electronically Signed   By: Carey BullocksWilliam  Veazey M.D.   On: 03/05/2019 12:10    Pending Labs Unresulted Labs (From admission, onward)    Start     Ordered   03/05/19 1334  HIV Antibody (routine testing w rflx)  (HIV Antibody (Routine testing w reflex) panel)  Once,   STAT    Comments: HIV screening recommended for all admitted patients    03/05/19 1333   03/05/19 1333  CULTURE, BLOOD (ROUTINE X 2) w Reflex to ID Panel  BLOOD CULTURE X 2,   STAT     03/05/19 1332   03/05/19 1255  SARS CORONAVIRUS 2 (TAT 6-24 HRS) Nasopharyngeal Nasopharyngeal Swab  (Asymptomatic/Tier 3)  Once,   STAT    Question Answer Comment  Is this test for diagnosis or screening Screening   Symptomatic for COVID-19 as defined by CDC No   Hospitalized for COVID-19 No   Admitted to ICU for COVID-19 No   Previously tested for COVID-19 Yes   Resident in a congregate (group) care setting No   Employed in healthcare setting No      03/05/19 1255   Signed and Held  Basic metabolic panel  Tomorrow morning,   R     Signed and Held   Signed and Held  CBC  Tomorrow morning,   R     Signed and Held   Signed and Held  Urine Drug Screen, Qualitative  ([facility] only)  Once,   R     Signed and Held          Vitals/Pain Today's Vitals   03/05/19 1432 03/05/19 1511 03/05/19 1515 03/05/19 1618  BP: (!) 132/95  (!) 163/83 139/84  Pulse: 97 (!) 160 (!) 148 (!) 102  Resp: 13 19 11 17   Temp: 99.5 F (37.5 C)   98 F (36.7 C)  TempSrc: Oral   Oral  SpO2: 98% 97% 99% 97%  Weight:      Height:      PainSc:        Isolation Precautions No active isolations  Medications Medications  oxyCODONE-acetaminophen (  PERCOCET/ROXICET) 5-325 MG per tablet 1 tablet (has no administration in time range)  ketorolac (TORADOL) 30 MG/ML injection 15 mg (15 mg Intravenous Given 03/05/19 1640)  acetaminophen (TYLENOL) tablet 650 mg (has no administration in time range)  ibuprofen (ADVIL) tablet 400 mg (400 mg Oral Given 03/05/19 1640)  0.9 %  sodium chloride infusion (has no administration in time range)  ondansetron (ZOFRAN) injection 4 mg (has no administration in time range)  Vancomycin (VANCOCIN) 1,500 mg in sodium chloride 0.9 % 500 mL IVPB (1,500 mg Intravenous New Bag/Given 03/05/19 1639)  piperacillin-tazobactam (ZOSYN) IVPB 3.375 g (has no administration in time range)  naloxone (NARCAN) 0.4 MG/ML injection (  Not Given 03/05/19 1645)  morphine 4 MG/ML injection 4 mg (4 mg Intravenous Given 03/05/19 1123)  iohexol (OMNIPAQUE) 300 MG/ML solution 100 mL (100 mLs Intravenous Contrast Given 03/05/19 1134)  morphine 4 MG/ML injection 4 mg (4 mg Intravenous Given 03/05/19 1327)  sodium chloride 0.9 % bolus 1,000 mL (1,000 mLs Intravenous New Bag/Given 03/05/19 1520)  sodium chloride 0.9 % bolus 1,000 mL (0 mLs Intravenous Stopped 03/05/19 1642)    Mobility walks Low fall risk   Focused Assessments n/a   R Recommendations: See Admitting Provider Note  Report given to:   Additional Notes: n/a

## 2019-03-05 NOTE — ED Triage Notes (Signed)
Pt arrives with IV in place. Pt states came from [facility] because he was there 12 hours and they did nothing.
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
## 2019-03-05 NOTE — ED Notes (Addendum)
This RN into room to see pt visitor (girlfriend) turning away from pt at the end of the bed with an insulin needle. Girlfriend hid the needle in a bag under her coat. When this RN asks about what she has, both pt and visitor lie. This RN is able to take the bag from pt with potential drugs inside. Security called, Agricultural consultant notified.  Pt denies taking anything or girlfriend giving him anything. Heart rate continues to run in the 150s. Resp rate is 9. Pt states he is just freaking out.

## 2019-03-05 NOTE — ED Provider Notes (Signed)
[facility] Emergency Department Provider Note   ____________________________________________   First MD Initiated Contact with Patient 03/05/19 1101     (approximate)  I have reviewed the triage vital signs and the nursing notes.   HISTORY  Chief Complaint Cellulitis    HPI Leon Riley is a 38 y.o. male patient presents with edema and erythema to the left upper extremity on 03/04/2019 in the late evening..  Patient was initially seen at seen at [facility] and was transported to [facility] secondary to the marked edema and erythema of the left upper extremity.  He was seen by the [facility] emergency room and was pending evaluation by CT for cellulitis versus compartment syndrome.  Patient was given IV antibiotics of vancomycin and Zosyn.  Patient left [facility] with IV intact and reported to this facility state he was tied with a long wait.  Patient is requesting pain medication and further treatment.  Patient is a known IV drug abuser.    And came to this ED. Past Medical History:  Diagnosis Date   IV drug abuse ([place])    Spontaneous pneumothorax     Patient Active Problem List   Diagnosis Date Noted   IV drug abuse ([place])    Cellulitis of left arm    Abscess of left arm    Tobacco abuse    Cellulitis of left foot 02/26/2017    Past Surgical History:  Procedure Laterality Date   CHEST TUBE INSERTION      Prior to Admission medications   Medication Sig Start Date End Date Taking? Authorizing Provider  Naloxone HCl 0.4 MG/0.4ML SOAJ Inject 0.4 mg as directed once as needed (heroin overdose). Inject 1 mL intramuscularly upon signs of opioid overdose. May repeat X 1. Call 911. 05/03/15   Everlene Balls, MD    Allergies Patient has no known allergies.  Family History  Problem Relation Age of Onset   Diabetes Father     Social History Social History   Tobacco Use   Smoking status: Current Every Day Smoker   Smokeless tobacco: Never Used    Substance Use Topics   Alcohol use: Yes   Drug use: Yes    Types: IV, Cocaine    Comment: Pt states he shoots up cocaine approx 3x week    Review of Systems Constitutional: No fever/chills Eyes: No visual changes. ENT: No sore throat. Cardiovascular: Denies chest pain. Respiratory: Denies shortness of breath. Gastrointestinal: No abdominal pain.  No nausea, no vomiting.  No diarrhea.  No constipation. Genitourinary: Negative for dysuria. Musculoskeletal: Negative for back pain. Skin: Negative for rash.   edema to the left wrist and forearm. Neurological: Negative for headaches, focal weakness or numbness. Psychiatric:  IV drug abuse. ____________________________________________   PHYSICAL EXAM:  VITAL SIGNS: ED Triage Vitals  Enc Vitals Group     BP 03/05/19 1036 (!) 145/85     Pulse Rate 03/05/19 1036 96     Resp 03/05/19 1036 20     Temp 03/05/19 1036 98.6 F (37 C)     Temp Source 03/05/19 1036 Oral     SpO2 03/05/19 1036 96 %     Weight 03/05/19 1022 150 lb (68 kg)     Height 03/05/19 1022 5\' 10"  (1.778 m)     Head Circumference --      Peak Flow --      Pain Score 03/05/19 1022 10     Pain Loc --      Pain  Edu? --      Excl. in GC? --     Constitutional: Alert and oriented. Well appearing and in no acute distress. Cardiovascular: Normal rate, regular rhythm. Grossly normal heart sounds.  Good peripheral circulation. Respiratory: Normal respiratory effort.  No retractions. Lungs CTAB. Musculoskeletal: Marked edema to the left upper extremity.  No lower extremity tenderness nor edema.  No joint effusions. Neurologic:  Normal speech and language. No gross focal neurologic deficits are appreciated. No gait instability. Skin:  Skin is warm, dry and intact.  Multiple needle tracks left forearm.  Moderate edema from wrist to elbow. Psychiatric: Mood and affect are normal. Speech and behavior are normal.  ____________________________________________   LABS (all  labs ordered are listed, but only abnormal results are displayed)  Labs Reviewed  SARS CORONAVIRUS 2 (TAT 6-24 HRS)   ____________________________________________  EKG   ____________________________________________  RADIOLOGY  ED MD interpretation:    Official radiology report(s): Ct Forearm Left W Contrast  Result Date: 03/05/2019 CLINICAL DATA:  Edema in the left upper extremity. History of IV drug abuse. Suspect DVT or abscess. EXAM: CT OF THE UPPER LEFT EXTREMITY WITH CONTRAST TECHNIQUE: Multidetector CT imaging of the left forearm was performed according to the standard protocol following intravenous contrast administration. CONTRAST:  OMNIPAQUE IOHEXOL 300 MG/ML  SOLN COMPARISON:  None recent. Radiographs 11/20/2015. FINDINGS: Bones/Joint/Cartilage No evidence of acute fracture, dislocation or bone destruction. The joint spaces appear maintained at the left elbow and left wrist. No significant joint effusions. Ligaments Suboptimally assessed by CT. Muscles and Tendons There is a large peripherally enhancing intramuscular fluid collection in the radial aspect of the distal forearm, involving the flexor musculature. This measures up to 5.5 x 3.1 x 2.6 cm and is consistent with an abscess. No evidence of associated foreign body or soft tissue emphysema. No significant tenosynovitis. No other focal fluid collections are identified. Soft tissues There is diffuse subcutaneous edema throughout the forearm, extending into the dorsal aspect of the hand. No definite deep or superficial venous thrombosis identified. There are prominent lymph nodes in the antecubital fossa, likely reactive. IMPRESSION: 1. Large intramuscular abscess in the distal forearm, involving the flexor musculature. 2. No evidence of associated foreign body or soft tissue emphysema. 3. No evidence of osteomyelitis or septic joint. 4. Prominent lymph nodes in the antecubital fossa, likely reactive. Electronically Signed   By:  Carey Bullocks M.D.   On: 03/05/2019 12:10    ____________________________________________   PROCEDURES  Procedure(s) performed (including Critical Care):  Procedures   ____________________________________________   INITIAL IMPRESSION / ASSESSMENT AND PLAN / ED COURSE  As part of my medical decision making, I reviewed the following data within the electronic MEDICAL RECORD NUMBER     Patient presents with pain, edema, erythema to the left forearm.  Patient known IV drug use with multiple tracts in the left forearm.  Discussed CT findings with patient.  Patient will be admitted by hospitalist with a consult to either orthopedics or surgery.        ____________________________________________   FINAL CLINICAL IMPRESSION(S) / ED DIAGNOSES  Final diagnoses:  Abscess of left forearm     ED Discharge Orders    None       Note:  This document was prepared using Dragon voice recognition software and may include unintentional dictation errors.    Joni Reining, PA-C 03/05/19 1309    Minna Antis, MD 03/05/19 1409

## 2019-03-05 NOTE — ED Notes (Signed)
Report given to [facility] RN  Pt moved to room 37

## 2019-03-05 NOTE — ED Notes (Addendum)
Pt informs this RN about his experience at [facility] this morning. Pt advises this RN that he left AMA from [facility] and left with his IV in his arm. Asked pt why he did not have someone remove it prior to leaving. Pt states he did not want to have to wait. Pt then states he waved at security as he left with the arm that the IV was in so they could see it and felt it was funny nobody stopped him.

## 2019-03-05 NOTE — Consult Note (Signed)
ORTHOPAEDIC CONSULTATION  REQUESTING PHYSICIAN: Lorretta Harp, MD  Chief Complaint:   Left forearm pain and swelling  History of Present Illness: Leon Riley is a 38 y.o. male with a history of IV drug abuse who presented to the local [facility] emergency room in [facility] late last evening complaining of a 3-day history of progressively worsening pain, swelling, and erythema to his left forearm.  The patient was diagnosed with forearm cellulitis, but concern was raised for possible compartment syndrome, so the patient was transferred to the main [facility] hospital emergency room.  Apparently, the patient sat there for 6 hours and became frustrated so he left AMA and instead presented to [facility] for further evaluation and treatment.  The patient denies any numbness or paresthesias to his fingers, but does note moderate discomfort in his forearm.  He is able to flex and extend all digits.  The patient denies any specific injury to the left forearm region, but does have a history of IV drug abuse.  Past Medical History:  Diagnosis Date  . IV drug abuse ([facility])   . Spontaneous pneumothorax    Past Surgical History:  Procedure Laterality Date  . CHEST TUBE INSERTION     Social History   Socioeconomic History  . Marital status: Divorced    Spouse name: Not on file  . Number of children: Not on file  . Years of education: Not on file  . Highest education level: Not on file  Occupational History  . Not on file  Social Needs  . Financial resource strain: Not on file  . Food insecurity    Worry: Not on file    Inability: Not on file  . Transportation needs    Medical: Not on file    Non-medical: Not on file  Tobacco Use  . Smoking status: Current Every Day Smoker  . Smokeless tobacco: Never Used  Substance and Sexual Activity  . Alcohol use: Yes  . Drug use: Yes    Types: IV, Cocaine    Comment: Pt states he shoots up cocaine approx 3x  week  . Sexual activity: Not on file  Lifestyle  . Physical activity    Days per week: Not on file    Minutes per session: Not on file  . Stress: Not on file  Relationships  . Social Musician on phone: Not on file    Gets together: Not on file    Attends religious service: Not on file    Active member of club or organization: Not on file    Attends meetings of clubs or organizations: Not on file    Relationship status: Not on file  Other Topics Concern  . Not on file  Social History Narrative  . Not on file   Family History  Problem Relation Age of Onset  . Diabetes Father    No Known Allergies Prior to Admission medications   Medication Sig Start Date End Date Taking? Authorizing Provider  Naloxone HCl 0.4 MG/0.4ML SOAJ Inject 0.4 mg as directed once as needed (heroin overdose). Inject 1 mL intramuscularly upon signs of opioid overdose. May repeat X 1. Call 911. 05/03/15   Tomasita Crumble, MD   Ct Forearm Left W Contrast  Result Date: 03/05/2019 CLINICAL DATA:  Edema in the left upper extremity. History of IV drug abuse. Suspect DVT or abscess. EXAM: CT OF THE UPPER LEFT EXTREMITY WITH CONTRAST TECHNIQUE: Multidetector CT imaging of the left forearm was performed according to the standard  protocol following intravenous contrast administration. CONTRAST:  151mL OMNIPAQUE IOHEXOL 300 MG/ML  SOLN COMPARISON:  None recent. Radiographs 11/20/2015. FINDINGS: Bones/Joint/Cartilage No evidence of acute fracture, dislocation or bone destruction. The joint spaces appear maintained at the left elbow and left wrist. No significant joint effusions. Ligaments Suboptimally assessed by CT. Muscles and Tendons There is a large peripherally enhancing intramuscular fluid collection in the radial aspect of the distal forearm, involving the flexor musculature. This measures up to 5.5 x 3.1 x 2.6 cm and is consistent with an abscess. No evidence of associated foreign body or soft tissue emphysema.  No significant tenosynovitis. No other focal fluid collections are identified. Soft tissues There is diffuse subcutaneous edema throughout the forearm, extending into the dorsal aspect of the hand. No definite deep or superficial venous thrombosis identified. There are prominent lymph nodes in the antecubital fossa, likely reactive. IMPRESSION: 1. Large intramuscular abscess in the distal forearm, involving the flexor musculature. 2. No evidence of associated foreign body or soft tissue emphysema. 3. No evidence of osteomyelitis or septic joint. 4. Prominent lymph nodes in the antecubital fossa, likely reactive. Electronically Signed   By: Richardean Sale M.D.   On: 03/05/2019 12:10    Positive ROS: All other systems have been reviewed and were otherwise negative with the exception of those mentioned in the HPI and as above.  Physical Exam: General:  Alert, no acute distress Psychiatric:  Patient is competent for consent with normal mood and affect   Cardiovascular:  No pedal edema Respiratory:  No wheezing, non-labored breathing GI:  Abdomen is soft and non-tender Skin:  No lesions in the area of chief complaint Neurologic:  Sensation intact distally Lymphatic:  No axillary or cervical lymphadenopathy  Orthopedic Exam:  Orthopedic examination is limited to the left forearm and hand.  Moderate swelling of the entire forearm is noted extending into the hand, but there is no significant compartment tightness to suggest acute compartment syndrome.  He has an area of erythema over the mid and distal volar forearm region as well as areas of healed scabs consistent with a history of IV drug abuse there is moderate tenderness to palpation over the volar forearm region in the area of erythema, but no pain more proximally.  He is able active flex and extend all digits without any significant discomfort, although motion is somewhat limited due to the swelling.  He has intact sensation to light touch to all  distributions, and good capillary refill to all digits.  X-rays:  A recent CT scan of the left forearm has been obtained and is available for review.  By report, this study demonstrates an abscess in the volar forearm region in the mid/distal third of the forearm measuring approximately 5 x 3 x 2.5 cm.  There does not appear to be any bony involvement.  This study has been reviewed by myself and discussed with the patient.  Assessment: Left volar forearm abscess in patient with history of IV drug abuse.  Plan: The treatment options have been discussed with the patient and the patient would like to proceed with surgical intervention to include formal irrigation and debridement of the volar forearm abscess.  I will plan on doing this tomorrow so that we can have his Covid test back as the patient is not acutely septic and shows no evidence for acute compartment syndrome on examination.  The procedure has been discussed in detail with the patient, as have the potential risks (including bleeding, persistent or recurrent infection,  nerve and/or blood vessel injury, persistent or recurrent pain, stiffness or weakness of grip, need for further surgery, blood clots, strokes, heart attacks and/or arrhythmias, etc.) and benefits.  The patient states his understanding and wishes to proceed.  A formal written consent has been obtained.  Thank you for asking me to participate in the care of this most unfortunate man.  I will follow him with you.   Maryagnes AmosJ. Jeffrey Poggi, MD  Beeper #:  [phone]  03/05/2019 5:40 PM

## 2019-03-05 NOTE — ED Triage Notes (Signed)
Pt continuously asking for pain medication

## 2019-03-05 NOTE — ED Triage Notes (Signed)
Pt states he did received a dose of antibiotics but they would not give him pain medications so he left. Pt states [facility] gave him IV fentanyl but [facility] only gave him a tylenol 3.

## 2019-03-05 NOTE — ED Notes (Signed)
See triage note  Presents with pain and swelling to left f/a  also presents with a 20 g saline lock in arm  States he went to [facility] but left  Swelling ntoed tfrom hand into f/a

## 2019-03-05 NOTE — H&P (Signed)
History and Physical    Leon Riley SNK:[id number] DOB: March 31, 1980 DOA: 03/05/2019  Referring MD/NP/PA:   PCP: Patient, No Pcp Per   Patient coming from:  The patient is coming from home.  At baseline, pt is independent for most of ADL.        Chief Complaint:  Left arm pain  HPI: Leon Riley is a 38 y.o. male with medical history significant of IVDU, polysubstance abuse, tobacco abuse, who presents with left arm pain.  Patient states that he has been having left arm swelling, redness and pain for more than 3 days, which has been progressively worsening.  Denies fever or chills.  He states that the pain is constant, 10 out of 10 in severity, sharp, nonradiating. Patient initially presented to the local [facility] emergency room in [place] late last evening. The patient was diagnosed with forearm cellulitis, but concern was raised for possible compartment syndrome, so the patient was transferred to the main [facility] hospital emergency room. Apparently, the patient was waiting there for 6 hours and became frustrated so he left AMA and instead presented to [facility] for further evaluation and treatment.  Patient does not have chest pain, shortness breath, cough, nausea, vomiting, diarrhea, abdominal, symptoms of UTI or unilateral weakness.  ED Course: pt was found to have WBC 11.0 on 03/04/19, electrolytes renal function albumin of 03/04/19.  Pending COVID-19 test, temperature normal, blood pressure 145/85, heart rate 90s, oxygen sat 96% on room air.  Patient is admitted to [facility] bed as inpatient.  Orthopedic surgeon, Dr. Roland Rack was consulted.   # CT-left arm: 1. Large intramuscular abscess in the distal forearm, involving the flexor musculature. 2. No evidence of associated foreign body or soft tissue emphysema. 3. No evidence of osteomyelitis or septic joint. 4. Prominent lymph nodes in the antecubital fossa, likely reactive.  Review of Systems:   General: no fevers, chills, no body weight gain, has  fatigue HEENT: no blurry vision, hearing changes or sore throat Respiratory: no dyspnea, coughing, wheezing CV: no chest pain, no palpitations GI: no nausea, vomiting, abdominal pain, diarrhea, constipation GU: no dysuria, burning on urination, increased urinary frequency, hematuria  Ext: no leg edema. Left forearm is swelling, erythema, warm and tender. Neuro: no unilateral weakness, numbness, or tingling, no vision change or hearing loss Skin: no rash MSK: No muscle spasm, no deformity, no limitation of range of movement in spin Heme: No easy bruising.  Travel history: No recent long distant travel.  Allergy: No Known Allergies  Past Medical History:  Diagnosis Date   IV drug abuse ([place])    Spontaneous pneumothorax     Past Surgical History:  Procedure Laterality Date   CHEST TUBE INSERTION      Social History:  reports that he has been smoking. He has never used smokeless tobacco. He reports current alcohol use. He reports current drug use. Drugs: IV and Cocaine.  Family History:  Family History  Problem Relation Age of Onset   Diabetes Father      Prior to Admission medications   Medication Sig Start Date End Date Taking? Authorizing Provider  Naloxone HCl 0.4 MG/0.4ML SOAJ Inject 0.4 mg as directed once as needed (heroin overdose). Inject 1 mL intramuscularly upon signs of opioid overdose. May repeat X 1. Call 911. 05/03/15   Everlene Balls, MD    Physical Exam: Vitals:   03/05/19 1432 03/05/19 1511 03/05/19 1515 03/05/19 1618  BP: (!) 132/95  (!) 163/83 139/84  Pulse: 97 (!) 160 Marland Kitchen)  148 (!) 102  Resp: _0 Temp: 99.5 F (37.5 C)   98 F (36.7 C)  TempSrc: Oral   Oral  SpO2: 98% 97% 99% 97%  Weight:      Height:       General: Not in acute distress HEENT:       Eyes: PERRL, EOMI, no scleral icterus.       ENT: No discharge from the ears and nose, no pharynx injection, no tonsillar enlargement.        Neck: No JVD, no bruit, no mass felt. Heme: No  neck lymph node enlargement. Cardiac: S1/S2, RRR, No murmurs, No gallops or rubs. Respiratory: No rales, wheezing, rhonchi or rubs. GI: Soft, nondistended, nontender, no rebound pain, no organomegaly, BS present. GU: No hematuria Ext: No pitting leg edema bilaterally. 2+DP/PT pulse bilaterally. Left forearm is swelling, erythema, warm and tender. Has multiple needle tracks in left forearm Musculoskeletal: No joint deformities, No joint redness or warmth, no limitation of ROM in spin. Skin: No rashes.  Neuro: Alert, oriented X3, cranial nerves II-XII grossly intact, moves all extremities normally.  Labs on Admission: I have personally reviewed following labs and imaging studies  CBC: No results for input(s): WBC, NEUTROABS, HGB, HCT, MCV, PLT in the last 168 hours. Basic Metabolic Panel: Recent Labs  Lab 03/05/19 1445  CREATININE 0.96   GFR: Estimated Creatinine Clearance: 101.3 mL/min (by C-G formula based on SCr of 0.96 mg/dL). Liver Function Tests: No results for input(s): AST, ALT, ALKPHOS, BILITOT, PROT, ALBUMIN in the last 168 hours. No results for input(s): LIPASE, AMYLASE in the last 168 hours. No results for input(s): AMMONIA in the last 168 hours. Coagulation Profile: Recent Labs  Lab 03/05/19 1445  INR 1.0   Cardiac Enzymes: No results for input(s): CKTOTAL, CKMB, CKMBINDEX, TROPONINI in the last 168 hours. BNP (last 3 results) No results for input(s): PROBNP in the last 8760 hours. HbA1C: No results for input(s): HGBA1C in the last 72 hours. CBG: No results for input(s): GLUCAP in the last 168 hours. Lipid Profile: No results for input(s): CHOL, HDL, LDLCALC, TRIG, CHOLHDL, LDLDIRECT in the last 72 hours. Thyroid Function Tests: No results for input(s): TSH, T4TOTAL, FREET4, T3FREE, THYROIDAB in the last 72 hours. Anemia Panel: No results for input(s): VITAMINB12, FOLATE, FERRITIN, TIBC, IRON, RETICCTPCT in the last 72 hours. Urine analysis: No results found  for: COLORURINE, APPEARANCEUR, LABSPEC, PHURINE, GLUCOSEU, HGBUR, BILIRUBINUR, KETONESUR, PROTEINUR, UROBILINOGEN, NITRITE, LEUKOCYTESUR Sepsis Labs: _1 (procalcitonin:4,lacticidven:4) )No results found for this or any previous visit (from the past 240 hour(s)).   Radiological Exams on Admission: Ct Forearm Left W Contrast  Result Date: 03/05/2019 CLINICAL DATA:  Edema in the left upper extremity. History of IV drug abuse. Suspect DVT or abscess. EXAM: CT OF THE UPPER LEFT EXTREMITY WITH CONTRAST TECHNIQUE: Multidetector CT imaging of the left forearm was performed according to the standard protocol following intravenous contrast administration. CONTRAST:  141m OMNIPAQUE IOHEXOL 300 MG/ML  SOLN COMPARISON:  None recent. Radiographs 11/20/2015. FINDINGS: Bones/Joint/Cartilage No evidence of acute fracture, dislocation or bone destruction. The joint spaces appear maintained at the left elbow and left wrist. No significant joint effusions. Ligaments Suboptimally assessed by CT. Muscles and Tendons There is a large peripherally enhancing intramuscular fluid collection in the radial aspect of the distal forearm, involving the flexor musculature. This measures up to 5.5 x 3.1 x 2.6 cm and is consistent with an abscess. No evidence of associated foreign body or soft tissue emphysema.  No significant tenosynovitis. No other focal fluid collections are identified. Soft tissues There is diffuse subcutaneous edema throughout the forearm, extending into the dorsal aspect of the hand. No definite deep or superficial venous thrombosis identified. There are prominent lymph nodes in the antecubital fossa, likely reactive. IMPRESSION: 1. Large intramuscular abscess in the distal forearm, involving the flexor musculature. 2. No evidence of associated foreign body or soft tissue emphysema. 3. No evidence of osteomyelitis or septic joint. 4. Prominent lymph nodes in the antecubital fossa, likely reactive. Electronically  Signed   By: Richardean Sale M.D.   On: 03/05/2019 12:10     EKG: Independently reviewed.  Sinus tachycardia, QTC 439, nonspecific T routine..   Assessment/Plan Principal Problem:   Abscess of left arm Active Problems:   IV drug abuse ([facility])   Cellulitis of left arm   Tobacco abuse  Abscess of left arm and cellulitis of left arm: consulted orth, Dr. Roland Rack for possible I&D.  - will admit to med-surg bed as inpt - Empiric antimicrobial treatment with vancomycin and zosyn - PRN Ketoralac, tylenol, Percocet for pain and scheduled Ibuprofen - Blood cultures x 2  - ESR and CRP - IVF: 2 L of NS bolus in ED, followed by 100 cc/h  IV drug abuse ([place]) and polysubstance abuse -did conseling -consult to SW -check UDS  Tobacco abuse: -Nicotine patch    Inpatient status:  # Patient requires inpatient status due to high intensity of service, high risk for further deterioration and high frequency of surveillance required.  I certify that at the point of admission it is my clinical judgment that the patient will require inpatient hospital care spanning beyond 2 midnights from the point of admission.   This patient has multiple chronic comorbidities including IVDU, polysubstance abuse, tobacco abuse,  Now patient has presenting with abscess of left arm and cellulitis of left arm  The worrisome physical exam findings: left forearm  is swelling, erythema, warm and tender. Hasmultiple needle tracks in left forearm  The initial radiographic and laboratory data are worrisome because of a large abscess  Current medical needs: please see my assessment and plan  Predictability of an adverse outcome (risk): pt is IVDU, now presents with a alarge abscess of left arm and cellulitis of left arm. Pt dose not have good follow up, is at high risk of developing complications. Will need to treat in hospital for at least two days.          DVT ppx: SCD Code Status: Full code Family  Communication: None at bed side.    Disposition Plan:  Anticipate discharge back to previous home environment Consults called:  Ortho, Dr. Roland Rack Admission status: med-surg bed as inpt  Date of Service 03/05/2019    Ivor Costa [facility]   If 7PM-7AM, please contact night-coverage [email or web address] Password [email or web address] 03/05/2019, 6:08 PM

## 2019-03-06 ENCOUNTER — Inpatient Hospital Stay: Payer: Self-pay | Admitting: Anesthesiology

## 2019-03-06 ENCOUNTER — Encounter
Admission: EM | Discharge status: Against Medical Advice | Payer: Self-pay | Source: Home / Self Care | Attending: Internal Medicine

## 2019-03-06 HISTORY — PX: APPLICATION OF WOUND VAC: SHX5189

## 2019-03-06 HISTORY — PX: I & D EXTREMITY: SHX5045

## 2019-03-06 LAB — SARS CORONAVIRUS 2 (TAT 6-24 HRS): SARS Coronavirus 2: NEGATIVE

## 2019-03-06 LAB — URINE DRUG SCREEN, QUALITATIVE (ARMC ONLY)
Amphetamines, Ur Screen: NOT DETECTED
Barbiturates, Ur Screen: NOT DETECTED
Benzodiazepine, Ur Scrn: NOT DETECTED
Cannabinoid 50 Ng, Ur ~~LOC~~: NOT DETECTED
Cocaine Metabolite,Ur ~~LOC~~: NOT DETECTED
MDMA (Ecstasy)Ur Screen: NOT DETECTED
Methadone Scn, Ur: NOT DETECTED
Opiate, Ur Screen: POSITIVE — AB
Phencyclidine (PCP) Ur S: NOT DETECTED
Tricyclic, Ur Screen: NOT DETECTED

## 2019-03-06 LAB — GLUCOSE, CAPILLARY: Glucose-Capillary: 139 mg/dL — ABNORMAL HIGH (ref 70–99)

## 2019-03-06 LAB — SEDIMENTATION RATE: Sed Rate: 20 mm/hr — ABNORMAL HIGH (ref 0–15)

## 2019-03-06 SURGERY — IRRIGATION AND DEBRIDEMENT EXTREMITY
Anesthesia: General | Laterality: Left

## 2019-03-06 MED ORDER — FENTANYL CITRATE (PF) 100 MCG/2ML IJ SOLN
INTRAMUSCULAR | Status: AC
  Administered 2019-03-06: 25 ug via INTRAVENOUS
  Filled 2019-03-06: qty 2

## 2019-03-06 MED ORDER — MIDAZOLAM HCL 2 MG/2ML IJ SOLN
INTRAMUSCULAR | Status: AC
  Filled 2019-03-06: qty 2

## 2019-03-06 MED ORDER — FLEET ENEMA 7-19 GM/118ML RE ENEM: 1.0000 | ENEMA | Freq: Once | RECTAL | Status: DC | PRN

## 2019-03-06 MED ORDER — METOCLOPRAMIDE HCL 5 MG/ML IJ SOLN: 5.0000 mg | Freq: Three times a day (TID) | INTRAMUSCULAR | Status: DC | PRN

## 2019-03-06 MED ORDER — FENTANYL CITRATE (PF) 100 MCG/2ML IJ SOLN
INTRAMUSCULAR | Status: DC | PRN
  Administered 2019-03-06 (×2): 50 ug via INTRAVENOUS

## 2019-03-06 MED ORDER — DEXAMETHASONE SODIUM PHOSPHATE 10 MG/ML IJ SOLN
INTRAMUSCULAR | Status: AC
  Filled 2019-03-06: qty 1

## 2019-03-06 MED ORDER — PHENYLEPHRINE HCL (PRESSORS) 10 MG/ML IV SOLN
INTRAVENOUS | Status: DC | PRN
  Administered 2019-03-06: 100 ug via INTRAVENOUS

## 2019-03-06 MED ORDER — PROPOFOL 10 MG/ML IV BOLUS
INTRAVENOUS | Status: DC | PRN
  Administered 2019-03-06: 50 mg via INTRAVENOUS

## 2019-03-06 MED ORDER — HYDROMORPHONE HCL 1 MG/ML IJ SOLN
INTRAMUSCULAR | Status: AC
  Filled 2019-03-06: qty 1

## 2019-03-06 MED ORDER — ONDANSETRON HCL 4 MG/2ML IJ SOLN
4.0000 mg | Freq: Four times a day (QID) | INTRAMUSCULAR | Status: DC | PRN
  Administered 2019-03-08: 4 mg via INTRAVENOUS

## 2019-03-06 MED ORDER — METOCLOPRAMIDE HCL 10 MG PO TABS: 5.0000 mg | ORAL_TABLET | Freq: Three times a day (TID) | ORAL | Status: DC | PRN

## 2019-03-06 MED ORDER — DOCUSATE SODIUM 100 MG PO CAPS
100.0000 mg | ORAL_CAPSULE | Freq: Two times a day (BID) | ORAL | Status: DC
  Administered 2019-03-06 – 2019-03-07 (×2): 100 mg via ORAL
  Filled 2019-03-06 (×3): qty 1

## 2019-03-06 MED ORDER — LIDOCAINE HCL (PF) 2 % IJ SOLN
INTRAMUSCULAR | Status: AC
  Filled 2019-03-06: qty 10

## 2019-03-06 MED ORDER — DIPHENHYDRAMINE HCL 12.5 MG/5ML PO ELIX
12.5000 mg | ORAL_SOLUTION | ORAL | Status: DC | PRN
  Administered 2019-03-07: 25 mg via ORAL
  Filled 2019-03-06: qty 10

## 2019-03-06 MED ORDER — SUCCINYLCHOLINE CHLORIDE 20 MG/ML IJ SOLN
INTRAMUSCULAR | Status: AC
  Filled 2019-03-06: qty 1

## 2019-03-06 MED ORDER — HYDROMORPHONE HCL 1 MG/ML IJ SOLN
INTRAMUSCULAR | Status: DC | PRN
  Administered 2019-03-06: 0.5 mg via INTRAVENOUS
  Administered 2019-03-06: .8 mg via INTRAVENOUS
  Administered 2019-03-06: .2 mg via INTRAVENOUS

## 2019-03-06 MED ORDER — ONDANSETRON HCL 4 MG/2ML IJ SOLN: 4.0000 mg | Freq: Once | INTRAMUSCULAR | Status: DC | PRN

## 2019-03-06 MED ORDER — FENTANYL CITRATE (PF) 100 MCG/2ML IJ SOLN
INTRAMUSCULAR | Status: AC
  Filled 2019-03-06: qty 2

## 2019-03-06 MED ORDER — PROPOFOL 10 MG/ML IV BOLUS
INTRAVENOUS | Status: AC
  Filled 2019-03-06: qty 20

## 2019-03-06 MED ORDER — MAGNESIUM HYDROXIDE 400 MG/5ML PO SUSP: 30.0000 mL | Freq: Every day | ORAL | Status: DC | PRN

## 2019-03-06 MED ORDER — LACTATED RINGERS IV SOLN
INTRAVENOUS | Status: DC | PRN
  Administered 2019-03-06: 13:00:00 via INTRAVENOUS

## 2019-03-06 MED ORDER — DEXMEDETOMIDINE HCL 200 MCG/2ML IV SOLN
INTRAVENOUS | Status: DC | PRN
  Administered 2019-03-06: 4 ug via INTRAVENOUS
  Administered 2019-03-06 (×2): 8 ug via INTRAVENOUS

## 2019-03-06 MED ORDER — BISACODYL 10 MG RE SUPP: 10.0000 mg | Freq: Every day | RECTAL | Status: DC | PRN

## 2019-03-06 MED ORDER — ONDANSETRON HCL 4 MG/2ML IJ SOLN
INTRAMUSCULAR | Status: DC | PRN
  Administered 2019-03-06: 4 mg via INTRAVENOUS

## 2019-03-06 MED ORDER — ONDANSETRON HCL 4 MG/2ML IJ SOLN
INTRAMUSCULAR | Status: AC
  Filled 2019-03-06: qty 2

## 2019-03-06 MED ORDER — LIDOCAINE HCL (CARDIAC) PF 100 MG/5ML IV SOSY
PREFILLED_SYRINGE | INTRAVENOUS | Status: DC | PRN
  Administered 2019-03-06: 80 mg via INTRAVENOUS

## 2019-03-06 MED ORDER — PIPERACILLIN-TAZOBACTAM 3.375 G IVPB
INTRAVENOUS | Status: AC
  Filled 2019-03-06: qty 50

## 2019-03-06 MED ORDER — MIDAZOLAM HCL 2 MG/2ML IJ SOLN
INTRAMUSCULAR | Status: DC | PRN
  Administered 2019-03-06: 2 mg via INTRAVENOUS

## 2019-03-06 MED ORDER — IBUPROFEN 400 MG PO TABS
800.0000 mg | ORAL_TABLET | Freq: Three times a day (TID) | ORAL | Status: DC
  Administered 2019-03-06 – 2019-03-07 (×4): 800 mg via ORAL
  Filled 2019-03-06 (×4): qty 2

## 2019-03-06 MED ORDER — FENTANYL CITRATE (PF) 100 MCG/2ML IJ SOLN
25.0000 ug | INTRAMUSCULAR | Status: DC | PRN
  Administered 2019-03-06 (×4): 25 ug via INTRAVENOUS

## 2019-03-06 MED ORDER — DEXAMETHASONE SODIUM PHOSPHATE 10 MG/ML IJ SOLN
INTRAMUSCULAR | Status: DC | PRN
  Administered 2019-03-06: 10 mg via INTRAVENOUS

## 2019-03-06 MED ORDER — ONDANSETRON HCL 4 MG PO TABS: 4.0000 mg | ORAL_TABLET | Freq: Four times a day (QID) | ORAL | Status: DC | PRN

## 2019-03-06 SURGICAL SUPPLY — 39 items
BLADE SURG SZ10 CARB STEEL (BLADE) ×3 IMPLANT
BNDG COHESIVE 4X5 TAN STRL (GAUZE/BANDAGES/DRESSINGS) ×3 IMPLANT
BNDG ESMARK 4X12 TAN STRL LF (GAUZE/BANDAGES/DRESSINGS) ×3 IMPLANT
CANISTER SUCT 1200ML W/VALVE (MISCELLANEOUS) ×3 IMPLANT
CHLORAPREP W/TINT 26 (MISCELLANEOUS) ×3 IMPLANT
COVER WAND RF STERILE (DRAPES) ×3 IMPLANT
CUFF TOURN SGL QUICK 18X4 (TOURNIQUET CUFF) IMPLANT
CUFF TOURN SGL QUICK 24 (TOURNIQUET CUFF)
CUFF TRNQT CYL 24X4X16.5-23 (TOURNIQUET CUFF) IMPLANT
ELECT REM PT RETURN 9FT ADLT (ELECTROSURGICAL) ×3
ELECTRODE REM PT RTRN 9FT ADLT (ELECTROSURGICAL) ×1 IMPLANT
GLOVE BIO SURGEON STRL SZ8 (GLOVE) ×3 IMPLANT
GLOVE INDICATOR 8.0 STRL GRN (GLOVE) ×3 IMPLANT
GLOVE SURG ORTHO 8.5 STRL (GLOVE) ×3 IMPLANT
GOWN STRL REUS W/ TWL LRG LVL3 (GOWN DISPOSABLE) ×1 IMPLANT
GOWN STRL REUS W/ TWL XL LVL3 (GOWN DISPOSABLE) ×1 IMPLANT
GOWN STRL REUS W/TWL LRG LVL3 (GOWN DISPOSABLE) ×2
GOWN STRL REUS W/TWL XL LVL3 (GOWN DISPOSABLE) ×2
KIT TURNOVER KIT A (KITS) ×3 IMPLANT
LABEL OR SOLS (LABEL) ×3 IMPLANT
NDL SAFETY ECLIPSE 18X1.5 (NEEDLE) ×1 IMPLANT
NEEDLE HYPO 18GX1.5 SHARP (NEEDLE) ×2
NS IRRIG 1000ML POUR BTL (IV SOLUTION) ×3 IMPLANT
PACK EXTREMITY ARMC (MISCELLANEOUS) ×3 IMPLANT
PAD CAST CTTN 4X4 STRL (SOFTGOODS) ×1 IMPLANT
PADDING CAST COTTON 4X4 STRL (SOFTGOODS) ×2
SPLINT CAST 1 STEP 3X12 (MISCELLANEOUS) ×3 IMPLANT
SPONGE LAP 18X18 RF (DISPOSABLE) ×3 IMPLANT
STAPLER SKIN PROX 35W (STAPLE) ×3 IMPLANT
STOCKINETTE BIAS CUT 4 980044 (GAUZE/BANDAGES/DRESSINGS) ×3 IMPLANT
STOCKINETTE IMPERVIOUS 9X36 MD (GAUZE/BANDAGES/DRESSINGS) ×3 IMPLANT
SUT ETHILON 4-0 (SUTURE) ×2
SUT ETHILON 4-0 FS2 18XMFL BLK (SUTURE) ×1
SUT VIC AB 2-0 CT1 36 (SUTURE) ×3 IMPLANT
SUT VIC AB 4-0 SH 27 (SUTURE) ×2
SUT VIC AB 4-0 SH 27XANBCTRL (SUTURE) ×1 IMPLANT
SUT VICRYL+ 3-0 36IN CT-1 (SUTURE) ×3 IMPLANT
SUTURE ETHLN 4-0 FS2 18XMF BLK (SUTURE) ×1 IMPLANT
SYR 10ML LL (SYRINGE) ×3 IMPLANT

## 2019-03-06 NOTE — Progress Notes (Signed)
Patient is adament about " Chelsea Primus" his fiance coming, this Probation officer notified charge nurse and Helene Kelp called [facility] , [facility] states that she is absolutely not to be here while patient is hospitalized. Patient verbalilzed understanding and request for her to be able to bring " Magazines": , was told she can drop them off at the front desk.

## 2019-03-06 NOTE — Anesthesia Postprocedure Evaluation (Signed)
Anesthesia Post Note  Patient: Leon Riley  Procedure(s) Performed: IRRIGATION AND DEBRIDEMENT LEFT UPPER FOREARM (Left ) APPLICATION OF WOUND VAC (Left )  Patient location during evaluation: PACU Anesthesia Type: General Level of consciousness: awake and alert Pain management: pain level controlled Vital Signs Assessment: post-procedure vital signs reviewed and stable Respiratory status: spontaneous breathing and respiratory function stable Cardiovascular status: stable Anesthetic complications: no     Last Vitals:  Vitals:   03/06/19 0742 03/06/19 1259  BP: (!) 141/86 123/80  Pulse: 90 100  Resp: 17 16  Temp: 36.8 C 36.9 C  SpO2: 98% 98%    Last Pain:  Vitals:   03/06/19 1259  TempSrc: Oral  PainSc:                  [name] K

## 2019-03-06 NOTE — H&P (Signed)
H&P reviewed and patient re-examined. The patient feels that his arm is actually feeling somewhat better this morning, so hopefully the antibiotics are working. He again is able actively flex and extend all digits and is neurovascularly intact to his left thumb. No other changes are noted either.

## 2019-03-06 NOTE — Progress Notes (Signed)
Patient went into restroom with door closed , Telesitter called charge nurse. Educated patient he could close door to room , however not to restroom d/t safety precautions , patient then proceed to sling his IV pole and lay down in bed ,. There is foul smell coming from patients room , he had door closed when this writer entered room,. He is in bed at this time looking at magazine that he is holding in front of his face.

## 2019-03-06 NOTE — Anesthesia Preprocedure Evaluation (Addendum)
Anesthesia Evaluation  Patient identified by MRN, date of birth, ID band Patient awake    Reviewed: Allergy & Precautions, NPO status , Patient's Chart, lab work & pertinent test results  History of Anesthesia Complications Negative for: history of anesthetic complications  Airway Mallampati: II       Dental   Pulmonary neg sleep apnea, neg COPD, Current Smoker,           Cardiovascular (-) hypertension(-) Past MI and (-) CHF (-) dysrhythmias (-) Valvular Problems/Murmurs     Neuro/Psych neg Seizures    GI/Hepatic neg GERD  ,(+)     substance abuse  cocaine use and IV drug use,   Endo/Other  neg diabetes  Renal/GU negative Renal ROS     Musculoskeletal  (+) narcotic dependent  Abdominal   Peds  Hematology   Anesthesia Other Findings   Reproductive/Obstetrics                            Anesthesia Physical Anesthesia Plan  ASA: III  Anesthesia Plan: General   Post-op Pain Management:    Induction: Intravenous  PONV Risk Score and Plan: 1 and Ondansetron  Airway Management Planned: LMA  Additional Equipment:   Intra-op Plan:   Post-operative Plan:   Informed Consent: I have reviewed the patients History and Physical, chart, labs and discussed the procedure including the risks, benefits and alternatives for the proposed anesthesia with the patient or authorized representative who has indicated his/her understanding and acceptance.       Plan Discussed with:   Anesthesia Plan Comments:         Anesthesia Quick Evaluation

## 2019-03-06 NOTE — Transfer of Care (Signed)
Immediate Anesthesia Transfer of Care Note  Patient: Dmoni Fortson  Procedure(s) Performed: IRRIGATION AND DEBRIDEMENT LEFT UPPER FOREARM (Left ) APPLICATION OF WOUND VAC (Left )  Patient Location: PACU  Anesthesia Type:General  Level of Consciousness: drowsy and patient cooperative  Airway & Oxygen Therapy: Patient Spontanous Breathing and Patient connected to face mask oxygen  Post-op Assessment: Report given to RN and Post -op Vital signs reviewed and stable  Post vital signs: Reviewed and stable  Last Vitals:  Vitals Value Taken Time  BP 133/88 03/06/19 1431  Temp 36.4 C 03/06/19 1430  Pulse 85 03/06/19 1431  Resp 25 03/06/19 1431  SpO2 98 % 03/06/19 1431    Last Pain:  Vitals:   03/06/19 1259  TempSrc: Oral  PainSc:          Complications: No apparent anesthesia complications

## 2019-03-06 NOTE — Anesthesia Procedure Notes (Signed)
Procedure Name: LMA Insertion Date/Time: 03/06/2019 1:45 PM Performed by: Lily Peer, Summer, RN Pre-anesthesia Checklist: Patient identified, Emergency Drugs available, Suction available and Patient being monitored Patient Re-evaluated:Patient Re-evaluated prior to induction Oxygen Delivery Method: Circle system utilized Preoxygenation: Pre-oxygenation with 100% oxygen Induction Type: IV induction Ventilation: Mask ventilation without difficulty LMA: LMA inserted LMA Size: 4.0 Number of attempts: 1 Placement Confirmation: positive ETCO2 and breath sounds checked- equal and bilateral Tube secured with: Tape Dental Injury: Teeth and Oropharynx as per pre-operative assessment

## 2019-03-06 NOTE — Progress Notes (Signed)
Given PRN percocet and toradol for c/o sever pain 9/10. IV to left Foot remains patent , patient is more alert and presents with brighter affect

## 2019-03-06 NOTE — Op Note (Signed)
03/06/2019  2:25 PM  Patient:   Leon Riley  Pre-Op Diagnosis:   Left forearm abscess  Post-Op Diagnosis:   Same  Procedure:   I&D of left forearm abscess  Surgeon:   Pascal Lux, MD  Assistant:   None  Anesthesia:   General LMA  Findings:   As above.  Complications:   None  Fluids:   300 cc crystalloid  EBL:   5 cc  UOP:   None  TT:   23 minutes at 2050 mmHg  Drains:   Wound VAC x1  Closure:   None  Brief Clinical Note:   The patient is a 38 year old male with a now 5-day history of progressively worsening pain and swelling of his left forearm.  He presented to the [facility] emergency room on Sunday evening and then was transferred to [facility] for definitive management.  However, he left AMA approximately 12 hours later and presented instead to [facility].  A CT scan of the left forearm in the emergency room confirmed the presence of a moderate sized abscess involving the distal volar radial forearm.  He was started on IV antibiotics and presents at this time for formal irrigation and debridement of his left volar forearm abscess.  Procedure:   The patient was brought into the operating room and lain in the supine position.  After adequate general laryngeal mask anesthesia was obtained, the patient's left hand and upper extremity were prepped with ChloraPrep solution before being draped sterilely.  Preoperative and biotics were administered.  A timeout was performed to verify the appropriate surgical site.  The limb was elevated for several minutes before a tourniquet was inflated to 250 mmHg.  An approximately 5 to 6 cm incision was made over the distal volar forearm centered over the area where it appeared as though the abscess was trying to point through the skin.  Approximately 15 cc of purulent material was expressed before the incision was opened sufficiently to fully evacuate the abscess.  The abscess was explored digitally to be sure that all loculations had been released.   The wound was copiously irrigated with 3 L of sterile saline solution using bulb irrigation before a wound VAC was applied.  The patient was then awakened, extubated, and returned to the recovery room in satisfactory condition after tolerating the procedure well.

## 2019-03-06 NOTE — Progress Notes (Signed)
PROGRESS NOTE    Leon Riley  ZOX:[id number] DOB: 15-Sep-1980 DOA: 03/05/2019 PCP: Patient, No Pcp Per      Brief Narrative:  Leon Riley is a 38 y.o. M with hx IVDU who presents with left wrist pain, swelling and carbuncles.  Patient evidently had had several days progressive swelling, redness and pain in the left arm/wrist without fever or chills.  Went to [facility] ER, diagnosed with cellulitis and possible compartment syndrome and was transferred to [facility] where he waited in the ER for 6 hours and left AMA, [facility].  In our ER, WBC 11K, electrolytes and renal function normal.  Orthopedics were consulted.  There was no compartment syndrome but operative I&D of the abscesses was recommended.     Assessment & Plan:  IVDU related abscess of the forearm No improvement yet.  Blood cultures no growth. -Continue vancomycin -Consult Orthopedics appreciate cares --> to the OR for I&D today     MDM and disposition: The below labs and imaging reports were reviewed and summarized above.  Medication management as above.  The patient was admitted with IV drug related abscess.         DVT prophylaxis: Lovenox Code Status: FULL Family Communication:     Consultants:   Orthopedics  Procedures:   12/8 operative I&D of abscess  Antimicrobials:   Vancomycin 12/7 >>     Subjective: No fever overnight.  Wrist is still red and swollen.  Objective: Vitals:   03/05/19 1842 03/06/19 0036 03/06/19 0742 03/06/19 1259  BP: 118/70 125/67 (!) 141/86 123/80  Pulse: 93 91 90 100  Resp: 20 17 17 16   Temp: 98.5 F (36.9 C) 97.9 F (36.6 C) 98.2 F (36.8 C) 98.5 F (36.9 C)  TempSrc:  Oral Oral Oral  SpO2: 96% 97% 98% 98%  Weight:      Height:        Intake/Output Summary (Last 24 hours) at 03/06/2019 1411 Last data filed at 03/06/2019 0300 Gross per 24 hour  Intake 119.51 ml  Output 900 ml  Net -780.49 ml   Filed Weights   03/05/19 1022  Weight: 68 kg     Examination: General appearance:  adult male, alert and in no acute distress.  Lying in bed, sleeping. HEENT: Anicteric, conjunctiva pink, lids and lashes normal. No nasal deformity, discharge, epistaxis.  Lips moist.   Skin: Warm and dry.  No jaundice.  No suspicious rashes or lesions.  Redness in the left wrist with multiple pustules. Cardiac: RRR, nl S1-S2, no murmurs appreciated.  Capillary refill is brisk.  JVP normal.  No LE edema.  Radial pulses 2+ and symmetric. Respiratory: Normal respiratory rate and rhythm.  CTAB without rales or wheezes. Abdomen: Abdomen soft.   No tenderness palpation or guarding. No ascites, distension, hepatosplenomegaly.   MSK: No deformities or effusions. Neuro: Awake and alert.  EOMI, moves all extremities. Speech fluent.    Psych: Sensorium intact and responding to questions, attention normal. Affect normal.  Judgment and insight appear normal.    Data Reviewed: I have personally reviewed following labs and imaging studies:  CBC: No results for input(s): WBC, NEUTROABS, HGB, HCT, MCV, PLT in the last 168 hours. Basic Metabolic Panel: Recent Labs  Lab 03/05/19 1445  CREATININE 0.96   GFR: Estimated Creatinine Clearance: 101.3 mL/min (by C-G formula based on SCr of 0.96 mg/dL). Liver Function Tests: No results for input(s): AST, ALT, ALKPHOS, BILITOT, PROT, ALBUMIN in the last 168 hours. No results for input(s):  LIPASE, AMYLASE in the last 168 hours. No results for input(s): AMMONIA in the last 168 hours. Coagulation Profile: Recent Labs  Lab 03/05/19 1445  INR 1.0   Cardiac Enzymes: No results for input(s): CKTOTAL, CKMB, CKMBINDEX, TROPONINI in the last 168 hours. BNP (last 3 results) No results for input(s): PROBNP in the last 8760 hours. HbA1C: No results for input(s): HGBA1C in the last 72 hours. CBG: Recent Labs  Lab 03/06/19 0748  GLUCAP 139*   Lipid Profile: No results for input(s): CHOL, HDL, LDLCALC, TRIG, CHOLHDL,  LDLDIRECT in the last 72 hours. Thyroid Function Tests: No results for input(s): TSH, T4TOTAL, FREET4, T3FREE, THYROIDAB in the last 72 hours. Anemia Panel: No results for input(s): VITAMINB12, FOLATE, FERRITIN, TIBC, IRON, RETICCTPCT in the last 72 hours. Urine analysis: No results found for: COLORURINE, APPEARANCEUR, LABSPEC, PHURINE, GLUCOSEU, HGBUR, BILIRUBINUR, KETONESUR, PROTEINUR, UROBILINOGEN, NITRITE, LEUKOCYTESUR Sepsis Labs: @LABRCNTIP (procalcitonin:4,lacticacidven:4)  ) Recent Results (from the past 240 hour(s))  SARS CORONAVIRUS 2 (TAT 6-24 HRS) Nasopharyngeal Nasopharyngeal Swab     Status: None   Collection Time: 03/05/19 12:45 PM   Specimen: Nasopharyngeal Swab  Result Value Ref Range Status   SARS Coronavirus 2 NEGATIVE NEGATIVE Final    Comment: (NOTE) SARS-CoV-2 target nucleic acids are NOT DETECTED. The SARS-CoV-2 RNA is generally detectable in upper and lower respiratory specimens during the acute phase of infection. Negative results do not preclude SARS-CoV-2 infection, do not rule out co-infections with other pathogens, and should not be used as the sole basis for treatment or other patient management decisions. Negative results must be combined with clinical observations, patient history, and epidemiological information. The expected result is Negative. Fact Sheet for Patients: SugarRoll.be Fact Sheet for Healthcare Providers: https://www.woods-mathews.com/ This test is not yet approved or cleared by the [place] FDA and  has been authorized for detection and/or diagnosis of SARS-CoV-2 by FDA under an Emergency Use Authorization (EUA). This EUA will remain  in effect (meaning this test can be used) for the duration of the COVID-19 declaration under Section 56 4(b)(1) of the Act, 21 U.S.C. section 360bbb-3(b)(1), unless the authorization is terminated or revoked sooner. Performed at [facility],  [place] [address] [place], [place] [address]   CULTURE, BLOOD (ROUTINE X 2) w Reflex to ID Panel     Status: None (Preliminary result)   Collection Time: 03/05/19  2:04 PM   Specimen: BLOOD  Result Value Ref Range Status   Specimen Description BLOOD BLOOD RIGHT FOREARM  Final   Special Requests   Final    BOTTLES DRAWN AEROBIC AND ANAEROBIC Blood Culture adequate volume   Culture   Final    NO GROWTH < 24 HOURS Performed at [facility], [address], [place], [place] [address]    Report Status PENDING  Incomplete  CULTURE, BLOOD (ROUTINE X 2) w Reflex to ID Panel     Status: None (Preliminary result)   Collection Time: 03/05/19  2:48 PM   Specimen: BLOOD  Result Value Ref Range Status   Specimen Description BLOOD RIGHT ANTECUBITAL  Final   Special Requests   Final    BOTTLES DRAWN AEROBIC AND ANAEROBIC Blood Culture adequate volume   Culture   Final    NO GROWTH < 24 HOURS Performed at [facility], [address] [place], [place] [address]    Report Status PENDING  Incomplete         Radiology Studies: Ct Forearm Left W Contrast  Result Date: 03/05/2019  CLINICAL DATA:  Edema in the left upper extremity. History of IV drug abuse. Suspect DVT or abscess. EXAM: CT OF THE UPPER LEFT EXTREMITY WITH CONTRAST TECHNIQUE: Multidetector CT imaging of the left forearm was performed according to the standard protocol following intravenous contrast administration. CONTRAST:  OMNIPAQUE IOHEXOL 300 MG/ML  SOLN COMPARISON:  None recent. Radiographs 11/20/2015. FINDINGS: Bones/Joint/Cartilage No evidence of acute fracture, dislocation or bone destruction. The joint spaces appear maintained at the left elbow and left wrist. No significant joint effusions. Ligaments Suboptimally assessed by CT. Muscles and Tendons There is a large peripherally enhancing intramuscular fluid collection in the radial aspect of the distal forearm, involving the flexor musculature. This measures  up to 5.5 x 3.1 x 2.6 cm and is consistent with an abscess. No evidence of associated foreign body or soft tissue emphysema. No significant tenosynovitis. No other focal fluid collections are identified. Soft tissues There is diffuse subcutaneous edema throughout the forearm, extending into the dorsal aspect of the hand. No definite deep or superficial venous thrombosis identified. There are prominent lymph nodes in the antecubital fossa, likely reactive. IMPRESSION: 1. Large intramuscular abscess in the distal forearm, involving the flexor musculature. 2. No evidence of associated foreign body or soft tissue emphysema. 3. No evidence of osteomyelitis or septic joint. 4. Prominent lymph nodes in the antecubital fossa, likely reactive. Electronically Signed   By: Carey Bullocks M.D.   On: 03/05/2019 12:10        Scheduled Meds:  [MAR Hold] ibuprofen  400 mg Oral TID   [MAR Hold] nicotine  21 mg Transdermal Daily   Continuous Infusions:  sodium chloride 100 mL/hr at 03/06/19 0300   [MAR Hold] piperacillin-tazobactam (ZOSYN)  IV 3.375 g (03/06/19 0640)   [MAR Hold] vancomycin 1,000 mg (03/06/19 0646)     LOS: 1 day    Time spent: 15 minutes    Alberteen Sam, MD [facility] 03/06/2019, 2:11 PM     Please page though AMION or Epic secure chat:  For [employer], Higher education careers adviser

## 2019-03-06 NOTE — Progress Notes (Signed)
Patient would like to know why he is on telesitter , and if he can have visit from his significant other if he is having surgery today

## 2019-03-06 NOTE — Progress Notes (Signed)
Patients Fiance brought him a bag of belongings, telestitter called and states patient took something and put it behind his bed, Patient states he has a  " Vape ciggarette " and then states " It is dead anyway and patient slung it in hospital bag, offered to put up . Patient states " This nicotine patch will not stay on me". Telesitter will continue to watch.

## 2019-03-06 NOTE — Anesthesia Post-op Follow-up Note (Signed)
Anesthesia QCDR form completed.        

## 2019-03-06 NOTE — Progress Notes (Signed)
Patient returned from I/D hemovac to left arm intact. New IV to Left foot on NS 75cc hour , VSS, patient asked to speak with supervisor regarding his significant other visiting, Per supervisor Tiffiany , RN , patient has been told no by [facility]. This Probation officer , that patients significant other cannot visit d/t incident that happen in the ER, Patient verbalized understanding and asked for the stuffed dragon sent by his significant other and the telephone. Remains on telesitter for observation,. Charge nurse found IV flush with medication in it that may be indicative of some substance used by patient in trash bin.

## 2019-03-06 NOTE — Progress Notes (Signed)
Pt refused to have labs drawn.

## 2019-03-07 ENCOUNTER — Encounter: Payer: Self-pay | Admitting: Surgery

## 2019-03-07 LAB — GLUCOSE, CAPILLARY: Glucose-Capillary: 124 mg/dL — ABNORMAL HIGH (ref 70–99)

## 2019-03-07 MED ORDER — DIAZEPAM 5 MG PO TABS
5.0000 mg | ORAL_TABLET | Freq: Once | ORAL | Status: AC
  Administered 2019-03-07: 5 mg via ORAL
  Filled 2019-03-07: qty 1

## 2019-03-07 NOTE — Evaluation (Signed)
Occupational Therapy Evaluation Patient Details Name: Leon Riley MRN: [id number] DOB: 01/23/1981 Today's Date: 03/07/2019    History of Present Illness 38 yo male admitted 03/05/19 with abscess of LUE. CT scan shows Large intramuscular abscess in the distal forearm. He is s/p I&D on 03/06/19. Patient was independent prior to admittance.PMH significant for drug abuse.   Clinical Impression   Mr. Raffel was seen for OT evaluation this date. Pt received supine in bed with tele-sitter present in room at start/end of session. Pt endorsing significant (9/10) pain in his LUE and requesting pain medication. OT informed pt primary RN who indicated pt not yet able to have additional pain medication. Pt made aware of pain medication schedule. Pt agreeable to OT evaluation. Pt endorses that PTA he was active and independent, working at [employer], and completing all self-care tasks independently. Pt lives with his girlfriend in a 1-level apartment home. Pt currently has MD orders for his LUE to remain NWB with wound vac in place at start/end of session. Pt reports feeling at baseline level of function for ADL management despite some concerns over his ability to immediately return to work. Pt RUE (dominant) WNL for strength, ROM, and FMC. LUE assessment limited 2/2 NWB status, however pt ROM and FMC remain WFL. Pt educated on NWB status and strategies to support functional independence during bathing and dressing tasks with strict instructions to await MD approval before bathing his LUE. Pt return verbalized understanding of education provided. All OT education completed at time of evaluation. No additional skilled OT needs identified. Will sign off at this time. Please re-consult if additional OT needs arise during this admission. Do not anticipate any follow-up OT needs.     Follow Up Recommendations  No OT follow up    Equipment Recommendations  None recommended by OT    Recommendations for Other  Services       Precautions / Restrictions Precautions Precautions: Fall Precaution Comments: moderate fall Restrictions Weight Bearing Restrictions: Yes LUE Weight Bearing: Non weight bearing      Mobility Bed Mobility Overal bed mobility: Independent                Transfers Overall transfer level: Independent                    Balance Overall balance assessment: Independent                                         ADL either performed or assessed with clinical judgement   ADL Overall ADL's : At baseline                                       General ADL Comments: Pt minimally functionally limited by his NWB status through his LUE (non-dominant). Pt and provider problem solved strategies for ADL mgt including compensatory bathing and dressing techniques with instructions for pt to follow MD recommendation on when to fully bath his operative arm. Pt concerned over wound vac placement and strategies for dressing. Verbalizes understanding of educaiton provided. Otherwise, at baseline level of performance for ADL tasks.     Vision Patient Visual Report: No change from baseline       Perception     Praxis      Pertinent Vitals/Pain Pain Assessment:  0-10 Pain Score: 9  Pain Location: LUE Pain Descriptors / Indicators: Sore;Aching;Grimacing;Guarding Pain Intervention(s): Limited activity within patient's tolerance;Monitored during session;Patient requesting pain meds-RN notified;Utilized relaxation techniques     Hand Dominance Right   Extremity/Trunk Assessment Upper Extremity Assessment Upper Extremity Assessment: LUE deficits/detail;Overall [facility] for tasks assessed(RUE strength, FMC, ROM all WNL.) LUE Deficits / Details: Pt has orders for LUE to remain NWB per MD. Wound vac in place at start/end of session. FMC/ROM WFL. LUE Coordination: decreased gross motor   Lower Extremity Assessment Lower Extremity Assessment:  Overall WFL for tasks assessed;Defer to PT evaluation   Cervical / Trunk Assessment Cervical / Trunk Assessment: Normal   Communication Communication Communication: No difficulties   Cognition Arousal/Alertness: Awake/alert Behavior During Therapy: WFL for tasks assessed/performed;Anxious;Agitated Overall Cognitive Status: Within Functional Limits for tasks assessed                                     General Comments  LUE wound vac intact at start/end of session. No swelling/edema noted. Pt concerned tape at inferior edge of bandage is coming off. Requesting additional tape to secure bandage.    Exercises Other Exercises Other Exercises: Pt educated in complementary alternative methods for pain management including distraction techniques, compensatory dressing strategies for management of LUE NWB status and wound vac, and falls prevention strategies this date.   Shoulder Instructions      Home Living Family/patient expects to be discharged to:: Private residence Living Arrangements: Spouse/significant other;Parent Available Help at Discharge: Family;Available PRN/intermittently Type of Home: Apartment(Lives in apartment behind moms house) Home Access: Level entry     Home Layout: One level     Bathroom Shower/Tub: Chief Strategy Officer: Standard     Home Equipment: Cane - single point;Crutches          Prior Functioning/Environment Level of Independence: Independent        Comments: did not use any assistive devices; denies any recent falls;        OT Problem List: Impaired UE functional use;Decreased coordination;Decreased strength;Decreased range of motion;Decreased safety awareness      OT Treatment/Interventions:      OT Goals(Current goals can be found in the care plan section) Acute Rehab OT Goals Patient Stated Goal: to go home. OT Goal Formulation: All assessment and education complete, DC therapy Time For Goal  Achievement: 03/07/19 Potential to Achieve Goals: Good  OT Frequency:     Barriers to D/C:            Co-evaluation              AM-PAC OT "6 Clicks" Daily Activity     Outcome Measure Help from another person eating meals?: None Help from another person taking care of personal grooming?: None Help from another person toileting, which includes using toliet, bedpan, or urinal?: None Help from another person bathing (including washing, rinsing, drying)?: None Help from another person to put on and taking off regular upper body clothing?: A Little Help from another person to put on and taking off regular lower body clothing?: None 6 Click Score: 23   End of Session    Activity Tolerance: Patient tolerated treatment well Patient left: in bed;with call bell/phone within reach;with bed alarm set;Other (comment)(With telesitter in room.)  OT Visit Diagnosis: Pain;Other abnormalities of gait and mobility (R26.89) Pain - Right/Left: Left Pain - part of body: Arm;Hand  Time: [id number] OT Time Calculation (min): 13 min Charges:  OT General Charges $OT Visit: 1 Visit OT Evaluation $OT Eval Low Complexity: 1 Low  Rockney GheeSerenity Sana Tessmer, M.S., OTR/L Ascom: [phone] 03/07/19, 10:55 AM

## 2019-03-07 NOTE — Progress Notes (Signed)
PROGRESS NOTE    Leon Riley  TDD:[id number] DOB: 1981-02-11 DOA: 03/05/2019 PCP: Patient, No Pcp Per    Brief Narrative:  Leon Riley is a 38 y.o. M with hx IVDU who presents with left wrist pain, swelling and carbuncles.  Patient evidently had had several days progressive swelling, redness and pain in the left arm/wrist without fever or chills.  Went to [facility] ER, diagnosed with cellulitis and possible compartment syndrome and was transferred to [facility] where he waited in the ER for 6 hours and left AMA, presented to [facility].  In our ER, WBC 11K, electrolytes and renal function normal.  Orthopedics were consulted.  There was no compartment syndrome but operative I&D of the abscesses was recommended.    Consultants:   Orthopedics  Procedures:   12/8 operative I&D of abscess  Antimicrobials:   Vancomycin 12/7 >>     Subjective: Patient seen and examined.  Denies any arm pain, chest pain or shortness of breath.  Talking about needing to go home because he has to get back to work.  Objective: Vitals:   03/06/19 1951 03/06/19 2354 03/07/19 0414 03/07/19 0854  BP: 124/64 128/77 117/72 119/68  Pulse: 87 84 81 82  Resp: 15 16 16 18   Temp: 97.8 F (36.6 C) 98.2 F (36.8 C) 97.9 F (36.6 C) 98.1 F (36.7 C)  TempSrc:   Oral   SpO2: 95% 95% 98% 98%  Weight:      Height:        Intake/Output Summary (Last 24 hours) at 03/07/2019 1535 Last data filed at 03/07/2019 1515 Gross per 24 hour  Intake 1223.11 ml  Output 2880 ml  Net -1656.89 ml   Filed Weights   03/05/19 1022  Weight: 68 kg    Examination:  General exam: Appears calm and comfortable, mildly anxious  respiratory system: Clear to auscultation. Respiratory effort normal. Cardiovascular system: S1 & S2 heard, RRR. No JVD, murmurs, rubs, gallops or clicks.  Gastrointestinal system: Abdomen is nondistended, soft and nontender.  Normal bowel sounds heard. Central nervous system: Alert and oriented. No  focal neurological deficits. Extremities: No edema.  Left upper extremity wound VAC in place draining bloody fluid.  Left IV line in the left foot Skin: Warm dry Psychiatry: Judgement and insight appear normal. Mood & affect appropriate.     Data Reviewed: I have personally reviewed following labs and imaging studies  CBC: No results for input(s): WBC, NEUTROABS, HGB, HCT, MCV, PLT in the last 168 hours. Basic Metabolic Panel: Recent Labs  Lab 03/05/19 1445  CREATININE 0.96   GFR: Estimated Creatinine Clearance: 101.3 mL/min (by C-G formula based on SCr of 0.96 mg/dL). Liver Function Tests: No results for input(s): AST, ALT, ALKPHOS, BILITOT, PROT, ALBUMIN in the last 168 hours. No results for input(s): LIPASE, AMYLASE in the last 168 hours. No results for input(s): AMMONIA in the last 168 hours. Coagulation Profile: Recent Labs  Lab 03/05/19 1445  INR 1.0   Cardiac Enzymes: No results for input(s): CKTOTAL, CKMB, CKMBINDEX, TROPONINI in the last 168 hours. BNP (last 3 results) No results for input(s): PROBNP in the last 8760 hours. HbA1C: No results for input(s): HGBA1C in the last 72 hours. CBG: Recent Labs  Lab 03/06/19 0748 03/07/19 0856  GLUCAP 139* 124*   Lipid Profile: No results for input(s): CHOL, HDL, LDLCALC, TRIG, CHOLHDL, LDLDIRECT in the last 72 hours. Thyroid Function Tests: No results for input(s): TSH, T4TOTAL, FREET4, T3FREE, THYROIDAB in the last 72 hours. Anemia Panel:  No results for input(s): VITAMINB12, FOLATE, FERRITIN, TIBC, IRON, RETICCTPCT in the last 72 hours. Sepsis Labs: No results for input(s): PROCALCITON, LATICACIDVEN in the last 168 hours.  Recent Results (from the past 240 hour(s))  SARS CORONAVIRUS 2 (TAT 6-24 HRS) Nasopharyngeal Nasopharyngeal Swab     Status: None   Collection Time: 03/05/19 12:45 PM   Specimen: Nasopharyngeal Swab  Result Value Ref Range Status   SARS Coronavirus 2 NEGATIVE NEGATIVE Final    Comment:  (NOTE) SARS-CoV-2 target nucleic acids are NOT DETECTED. The SARS-CoV-2 RNA is generally detectable in upper and lower respiratory specimens during the acute phase of infection. Negative results do not preclude SARS-CoV-2 infection, do not rule out co-infections with other pathogens, and should not be used as the sole basis for treatment or other patient management decisions. Negative results must be combined with clinical observations, patient history, and epidemiological information. The expected result is Negative. Fact Sheet for Patients: HairSlick.nohttps://www.fda.gov/media/138098/download Fact Sheet for Healthcare Providers: quierodirigir.comhttps://www.fda.gov/media/138095/download This test is not yet approved or cleared by the [place] FDA and  has been authorized for detection and/or diagnosis of SARS-CoV-2 by FDA under an Emergency Use Authorization (EUA). This EUA will remain  in effect (meaning this test can be used) for the duration of the COVID-19 declaration under Section 56 4(b)(1) of the Act, 21 U.S.C. section 360bbb-3(b)(1), unless the authorization is terminated or revoked sooner. Performed at [facility], [address] [place], [place] [address]   CULTURE, BLOOD (ROUTINE X 2) w Reflex to ID Panel     Status: None (Preliminary result)   Collection Time: 03/05/19  2:04 PM   Specimen: BLOOD  Result Value Ref Range Status   Specimen Description BLOOD BLOOD RIGHT FOREARM  Final   Special Requests   Final    BOTTLES DRAWN AEROBIC AND ANAEROBIC Blood Culture adequate volume   Culture   Final    NO GROWTH 2 DAYS Performed at [facility], [address] [place], [place] [address]    Report Status PENDING  Incomplete  CULTURE, BLOOD (ROUTINE X 2) w Reflex to ID Panel     Status: None (Preliminary result)   Collection Time: 03/05/19  2:48 PM   Specimen: BLOOD  Result Value Ref Range Status   Specimen Description BLOOD RIGHT ANTECUBITAL  Final   Special Requests    Final    BOTTLES DRAWN AEROBIC AND ANAEROBIC Blood Culture adequate volume   Culture   Final    NO GROWTH 2 DAYS Performed at [facility], [address] [place], [place] [address]    Report Status PENDING  Incomplete  Aerobic/Anaerobic Culture (surgical/deep wound)     Status: None (Preliminary result)   Collection Time: 03/06/19  1:59 PM   Specimen: PATH Other; Tissue  Result Value Ref Range Status   Specimen Description   Final    ABSCESS Performed at [facility], [address] [place], [place] [address]    Special Requests   Final    LEFT UPPER FOREARM ABSCESS Performed at [facility], [address] [place], [place] [address]    Gram Stain   Final    FEW WBC PRESENT,BOTH PMN AND MONONUCLEAR FEW GRAM POSITIVE COCCI IN PAIRS RARE GRAM POSITIVE RODS    Culture   Final    TOO YOUNG TO READ Performed at [facility], [address] [place], [place] [address]    Report Status PENDING  Incomplete  Radiology Studies: No results found.      Scheduled Meds: . docusate sodium  100 mg Oral BID  . ibuprofen  800 mg Oral TID  . nicotine  21 mg Transdermal Daily   Continuous Infusions: . sodium chloride 100 mL/hr at 03/07/19 1513  . piperacillin-tazobactam (ZOSYN)  IV 3.375 g (03/07/19 1515)  . vancomycin 1,000 mg (03/07/19 7619)    Assessment & Plan:   Principal Problem:   Abscess of left arm Active Problems:   IV drug abuse (HCC)   Cellulitis of left arm   Tobacco abuse   IVDU related abscess of the forearm No improvement yet.  Blood cultures no growth. Afebrile Labs pending -Continue vancomycin and zosyn -s/p wound vac, s/p  I&D of left forearm abscess-03/06/19 -ortho following-plan for OR tomorrow for repeat irrigation and debridement to help expedite healing process  IVDU and polysubstance abuse Counseling was done during this admission SW consulted Monitor for withdrawl    DVT prophylaxis:  Lovenox Code Status: FULL Family Communication: none at bedside Disposition Plan: likely be here >2MN stays until medically stable.        LOS: 2 days   Time spent: 45 minutes with more than 50% COC    Lynn Ito, MD [facility] Pager [phone]  If 7PM-7AM, please contact night-coverage [email or web address] Password [email or web address] 03/07/2019, 3:35 PM

## 2019-03-07 NOTE — Evaluation (Signed)
Physical Therapy Evaluation Patient Details Name: Leon Riley MRN: [id number] DOB: 10-05-1980 Today's Date: 03/07/2019   History of Present Illness  38 yo male admitted 03/05/19 with abscess of LUE. CT scan shows Large intramuscular abscess in the distal forearm. He is s/p I&D on 03/06/19. Patient was independent prior to admittance.PMH significant for drug abuse.  Clinical Impression  38 yo Male admitted with LUE abscess, reports feeling fatigue this AM after not being able to sleep last night. Patient reports minimal pain and states that he has been up in his room without difficulty. Patient is independent in bed mobility and transfers. He ambulated 100 feet without AD carrying his wound vac with no imbalance and no difficulty. He exhibits reciprocal gait pattern with good dynamic balance control. He exhibits full functional ROM in BUE and BLE. He also exhibits functional strength in BLE.  Patient is currently independent in all self care ADLs and does not demonstrate need for skilled PT Intervention. Patient agreeable. Will complete order at this time.     Follow Up Recommendations No PT follow up    Equipment Recommendations  None recommended by PT    Recommendations for Other Services       Precautions / Restrictions Restrictions Weight Bearing Restrictions: Yes LUE Weight Bearing: Non weight bearing      Mobility  Bed Mobility Overal bed mobility: Independent                Transfers Overall transfer level: Independent                  Ambulation/Gait Ambulation/Gait assistance: Independent Gait Distance (Feet): 100 Feet Assistive device: None Gait Pattern/deviations: WFL(Within Functional Limits)     General Gait Details: pt exhibits good reciprocal gait pattern with no unsteadiness noted during turns or negotiating obstacles. Ambulates at functional gait speed.  Stairs            Wheelchair Mobility    Modified Rankin (Stroke Patients Only)        Balance Overall balance assessment: Independent                                           Pertinent Vitals/Pain Pain Assessment: 0-10 Pain Score: 2  Pain Location: LUE Pain Descriptors / Indicators: Sore Pain Intervention(s): Limited activity within patient's tolerance;Monitored during session;Repositioned    Home Living Family/patient expects to be discharged to:: Private residence Living Arrangements: Spouse/significant other;Parent Available Help at Discharge: Family;Available PRN/intermittently Type of Home: Apartment(lives in apartment behind mom's house) Home Access: Level entry     Home Layout: One level Home Equipment: Cane - single point;Crutches      Prior Function Level of Independence: Independent         Comments: did not use any assistive devices; denies any recent falls;     Hand Dominance        Extremity/Trunk Assessment   Upper Extremity Assessment Upper Extremity Assessment: Overall WFL for tasks assessed    Lower Extremity Assessment Lower Extremity Assessment: Overall WFL for tasks assessed    Cervical / Trunk Assessment Cervical / Trunk Assessment: Normal  Communication   Communication: No difficulties  Cognition Arousal/Alertness: Awake/alert Behavior During Therapy: WFL for tasks assessed/performed Overall Cognitive Status: Within Functional Limits for tasks assessed  General Comments General comments (skin integrity, edema, etc.): no edema noted; patient reports significant reduction in swelling in LUE; pt does have wound vac present in LUE    Exercises     Assessment/Plan    PT Assessment Patent does not need any further PT services  PT Problem List         PT Treatment Interventions      PT Goals (Current goals can be found in the Care Plan section)  Acute Rehab PT Goals Patient Stated Goal: to go home. PT Goal Formulation: With  patient Time For Goal Achievement: 03/07/19 Potential to Achieve Goals: Good    Frequency     Barriers to discharge        Co-evaluation               AM-PAC PT "6 Clicks" Mobility  Outcome Measure Help needed turning from your back to your side while in a flat bed without using bedrails?: None Help needed moving from lying on your back to sitting on the side of a flat bed without using bedrails?: None Help needed moving to and from a bed to a chair (including a wheelchair)?: None Help needed standing up from a chair using your arms (e.g., wheelchair or bedside chair)?: None Help needed to walk in hospital room?: None Help needed climbing 3-5 steps with a railing? : None 6 Click Score: 24    End of Session   Activity Tolerance: Patient tolerated treatment well;No increased pain Patient left: in bed;with call bell/phone within reach Nurse Communication: Mobility status      Time: 6256-3893 PT Time Calculation (min) (ACUTE ONLY): 11 min   Charges:   PT Evaluation $PT Eval Low Complexity: 1 Low            Nidhi Jacome PT, DPT 03/07/2019, 9:02 AM

## 2019-03-07 NOTE — Progress Notes (Signed)
Subjective: The patient notes significant improvement in his left forearm symptoms of pain and swelling.  He is able to active flex and extend all digits fully without any pain, and denies any numbness or paresthesias to any of his digits.   Objective: Vital signs in last 24 hours: Temp:  [97.5 F (36.4 C)-98.5 F (36.9 C)] 98.1 F (36.7 C) (12/09 0854) Pulse Rate:  [72-100] 82 (12/09 0854) Resp:  [10-27] 18 (12/09 0854) BP: (98-150)/(64-102) 119/68 (12/09 0854) SpO2:  [95 %-99 %] 98 % (12/09 0854)  Intake/Output from previous day: 12/08 0701 - 12/09 0700 In: 1833.1 [P.O.:480; I.V.:803.1; IV Piggyback:550] Out: 2385 [Urine:2380; Blood:5] Intake/Output this shift: Total I/O In: -  Out: 250 [Urine:250]  No results for input(s): HGB in the last 72 hours. No results for input(s): WBC, RBC, HCT, PLT in the last 72 hours. Recent Labs    03/05/19 1445  CREATININE 0.96   Recent Labs    03/05/19 1445  INR 1.0    Physical Exam: On examination, the swelling noted previously appears to be much improved.  No erythema or significant drainage is noted from the wound.  He is able active flex and extend all digits without any pain or triggering.  He is neurovascularly intact to all digits.  Assessment: Status post I&D of left volar forearm abscess.  Plan: The patient appears to be responding well to the present treatment.  He is encouraged to keep his hand elevated and to move his fingers as much as possible.  He is to continue to receive IV antibiotics as he presently is receiving.  Based on how well he is doing, I feel that we can return him to the operating room tomorrow and plan on performing a repeat irrigation and debridement as well as to try to close most of the wound to see if this can expedite his healing process.  This procedure has been discussed in detail with the patient, as have the potential risks (including bleeding, infection, nerve and/or blood vessel injury, persistent  or recurrent pain, persistence of infection, need for further surgery, blood clots, strokes, heart attacks and/or arrhythmias, etc.) and benefits.  The patient states his understanding wishes to proceed.  A formal written consent for this second procedure will be obtained by the nursing staff.   Marshall Cork Uzma Hellmer 03/07/2019, 10:55 AM

## 2019-03-07 NOTE — Progress Notes (Signed)
Nurse reports patient wanting to leave against medical advice due to anxiety.  I discussed with patient benefits and need for contiued care.  Pateitn verbalizes past experiences with anxiety and substance abuse.  Claims sobriety for 4 years (+ tox screen noted). Pateitn also upset regarding inability to have fiance at bedside.  Patient did agree to trying valieum to help with his anxiety and to help with his reported insomnia for tondight, and try to stay to have scheduled procedure in am. Plan discussed with RN

## 2019-03-08 ENCOUNTER — Inpatient Hospital Stay: Payer: Self-pay | Admitting: Certified Registered Nurse Anesthetist

## 2019-03-08 ENCOUNTER — Encounter: Payer: Self-pay | Admitting: Internal Medicine

## 2019-03-08 ENCOUNTER — Encounter
Admission: EM | Discharge status: Against Medical Advice | Payer: Self-pay | Source: Home / Self Care | Attending: Internal Medicine

## 2019-03-08 HISTORY — PX: I & D EXTREMITY: SHX5045

## 2019-03-08 LAB — CBC WITH DIFFERENTIAL/PLATELET
Abs Immature Granulocytes: 0.03 10*3/uL (ref 0.00–0.07)
Basophils Absolute: 0 10*3/uL (ref 0.0–0.1)
Basophils Relative: 0 %
Eosinophils Absolute: 0 10*3/uL (ref 0.0–0.5)
Eosinophils Relative: 1 %
HCT: 33.1 % — ABNORMAL LOW (ref 39.0–52.0)
Hemoglobin: 10.9 g/dL — ABNORMAL LOW (ref 13.0–17.0)
Immature Granulocytes: 0 %
Lymphocytes Relative: 39 %
Lymphs Abs: 2.7 10*3/uL (ref 0.7–4.0)
MCH: 24.9 pg — ABNORMAL LOW (ref 26.0–34.0)
MCHC: 32.9 g/dL (ref 30.0–36.0)
MCV: 75.7 fL — ABNORMAL LOW (ref 80.0–100.0)
Monocytes Absolute: 0.4 10*3/uL (ref 0.1–1.0)
Monocytes Relative: 5 %
Neutro Abs: 3.7 10*3/uL (ref 1.7–7.7)
Neutrophils Relative %: 55 %
Platelets: 252 10*3/uL (ref 150–400)
RBC: 4.37 MIL/uL (ref 4.22–5.81)
RDW: 14 % (ref 11.5–15.5)
WBC: 6.8 10*3/uL (ref 4.0–10.5)
nRBC: 0 % (ref 0.0–0.2)

## 2019-03-08 LAB — BASIC METABOLIC PANEL
Anion gap: 10 (ref 5–15)
BUN: 16 mg/dL (ref 6–20)
CO2: 19 mmol/L — ABNORMAL LOW (ref 22–32)
Calcium: 8.6 mg/dL — ABNORMAL LOW (ref 8.9–10.3)
Chloride: 112 mmol/L — ABNORMAL HIGH (ref 98–111)
Creatinine, Ser: 0.94 mg/dL (ref 0.61–1.24)
GFR calc Af Amer: >60 mL/min (ref >60)
GFR calc non Af Amer: >60 mL/min (ref >60)
Glucose, Bld: 100 mg/dL — ABNORMAL HIGH (ref 70–99)
Potassium: 3.4 mmol/L — ABNORMAL LOW (ref 3.5–5.1)
Sodium: 141 mmol/L (ref 135–145)

## 2019-03-08 LAB — C-REACTIVE PROTEIN: CRP: 4.9 mg/dL — ABNORMAL HIGH (ref <1.0)

## 2019-03-08 LAB — GLUCOSE, CAPILLARY: Glucose-Capillary: 93 mg/dL (ref 70–99)

## 2019-03-08 SURGERY — IRRIGATION AND DEBRIDEMENT EXTREMITY
Anesthesia: General | Site: Arm Lower | Laterality: Left

## 2019-03-08 MED ORDER — PROPOFOL 10 MG/ML IV BOLUS
INTRAVENOUS | Status: DC | PRN
  Administered 2019-03-08: 50 mg via INTRAVENOUS
  Administered 2019-03-08: 30 mg via INTRAVENOUS
  Administered 2019-03-08: 200 mg via INTRAVENOUS

## 2019-03-08 MED ORDER — FENTANYL CITRATE (PF) 100 MCG/2ML IJ SOLN
INTRAMUSCULAR | Status: AC
  Administered 2019-03-08: 25 ug via INTRAVENOUS
  Filled 2019-03-08: qty 2

## 2019-03-08 MED ORDER — PROPOFOL 10 MG/ML IV BOLUS
INTRAVENOUS | Status: AC
  Filled 2019-03-08: qty 20

## 2019-03-08 MED ORDER — FLEET ENEMA 7-19 GM/118ML RE ENEM: 1.0000 | ENEMA | Freq: Once | RECTAL | Status: DC | PRN

## 2019-03-08 MED ORDER — DOCUSATE SODIUM 100 MG PO CAPS: 100.0000 mg | ORAL_CAPSULE | Freq: Two times a day (BID) | ORAL | Status: DC

## 2019-03-08 MED ORDER — DEXAMETHASONE SODIUM PHOSPHATE 10 MG/ML IJ SOLN
INTRAMUSCULAR | Status: DC | PRN
  Administered 2019-03-08: 10 mg via INTRAVENOUS

## 2019-03-08 MED ORDER — BISACODYL 10 MG RE SUPP: 10.0000 mg | Freq: Every day | RECTAL | Status: DC | PRN

## 2019-03-08 MED ORDER — FENTANYL CITRATE (PF) 100 MCG/2ML IJ SOLN
25.0000 ug | INTRAMUSCULAR | Status: DC | PRN
  Administered 2019-03-08 (×3): 25 ug via INTRAVENOUS

## 2019-03-08 MED ORDER — DEXAMETHASONE SODIUM PHOSPHATE 10 MG/ML IJ SOLN
INTRAMUSCULAR | Status: AC
  Filled 2019-03-08: qty 1

## 2019-03-08 MED ORDER — METOCLOPRAMIDE HCL 5 MG/ML IJ SOLN: 5.0000 mg | Freq: Three times a day (TID) | INTRAMUSCULAR | Status: DC | PRN

## 2019-03-08 MED ORDER — ONDANSETRON HCL 4 MG/2ML IJ SOLN
INTRAMUSCULAR | Status: AC
  Filled 2019-03-08: qty 2

## 2019-03-08 MED ORDER — LIDOCAINE HCL (PF) 2 % IJ SOLN
INTRAMUSCULAR | Status: AC
  Filled 2019-03-08: qty 10

## 2019-03-08 MED ORDER — HYDROMORPHONE HCL 1 MG/ML IJ SOLN
INTRAMUSCULAR | Status: DC | PRN
  Administered 2019-03-08 (×2): .5 mg via INTRAVENOUS

## 2019-03-08 MED ORDER — HYDROMORPHONE HCL 1 MG/ML IJ SOLN
INTRAMUSCULAR | Status: AC
  Filled 2019-03-08: qty 1

## 2019-03-08 MED ORDER — NEOMYCIN-POLYMYXIN B GU 40-200000 IR SOLN
Status: DC | PRN
  Administered 2019-03-08: 4 mL

## 2019-03-08 MED ORDER — ONDANSETRON HCL 4 MG/2ML IJ SOLN: 4.0000 mg | Freq: Four times a day (QID) | INTRAMUSCULAR | Status: DC | PRN

## 2019-03-08 MED ORDER — MIDAZOLAM HCL 2 MG/2ML IJ SOLN
INTRAMUSCULAR | Status: DC | PRN
  Administered 2019-03-08: 2 mg via INTRAVENOUS

## 2019-03-08 MED ORDER — OXYCODONE HCL 5 MG PO TABS: 5.0000 mg | ORAL_TABLET | Freq: Once | ORAL | Status: DC | PRN

## 2019-03-08 MED ORDER — FENTANYL CITRATE (PF) 100 MCG/2ML IJ SOLN
INTRAMUSCULAR | Status: AC
  Filled 2019-03-08: qty 2

## 2019-03-08 MED ORDER — KETAMINE HCL 50 MG/ML IJ SOLN
INTRAMUSCULAR | Status: AC
  Filled 2019-03-08: qty 10

## 2019-03-08 MED ORDER — MAGNESIUM HYDROXIDE 400 MG/5ML PO SUSP: 30.0000 mL | Freq: Every day | ORAL | Status: DC | PRN

## 2019-03-08 MED ORDER — FENTANYL CITRATE (PF) 100 MCG/2ML IJ SOLN
INTRAMUSCULAR | Status: DC | PRN
  Administered 2019-03-08: 25 ug via INTRAVENOUS
  Administered 2019-03-08: 75 ug via INTRAVENOUS

## 2019-03-08 MED ORDER — DIPHENHYDRAMINE HCL 12.5 MG/5ML PO ELIX: 12.5000 mg | ORAL_SOLUTION | ORAL | Status: DC | PRN

## 2019-03-08 MED ORDER — DEXMEDETOMIDINE HCL 200 MCG/2ML IV SOLN
INTRAVENOUS | Status: DC | PRN
  Administered 2019-03-08: 12 ug via INTRAVENOUS
  Administered 2019-03-08: 8 ug via INTRAVENOUS
  Administered 2019-03-08: 12 ug via INTRAVENOUS

## 2019-03-08 MED ORDER — GLYCOPYRROLATE 0.2 MG/ML IJ SOLN
INTRAMUSCULAR | Status: DC | PRN
  Administered 2019-03-08: .2 mg via INTRAVENOUS

## 2019-03-08 MED ORDER — SULFAMETHOXAZOLE-TRIMETHOPRIM 800-160 MG PO TABS: 1.0000 | ORAL_TABLET | Freq: Two times a day (BID) | ORAL | 0 refills | Status: AC

## 2019-03-08 MED ORDER — KETAMINE HCL 10 MG/ML IJ SOLN
INTRAMUSCULAR | Status: DC | PRN
  Administered 2019-03-08: 20 mg via INTRAVENOUS

## 2019-03-08 MED ORDER — OXYCODONE HCL 5 MG/5ML PO SOLN: 5.0000 mg | Freq: Once | ORAL | Status: DC | PRN

## 2019-03-08 MED ORDER — LACTATED RINGERS IV SOLN
INTRAVENOUS | Status: DC
  Administered 2019-03-08: 13:00:00 via INTRAVENOUS

## 2019-03-08 MED ORDER — ONDANSETRON HCL 4 MG PO TABS: 4.0000 mg | ORAL_TABLET | Freq: Four times a day (QID) | ORAL | Status: DC | PRN

## 2019-03-08 MED ORDER — FENTANYL CITRATE (PF) 100 MCG/2ML IJ SOLN
INTRAMUSCULAR | Status: AC
  Administered 2019-03-08: 50 ug via INTRAVENOUS
  Filled 2019-03-08: qty 2

## 2019-03-08 MED ORDER — METOCLOPRAMIDE HCL 10 MG PO TABS: 5.0000 mg | ORAL_TABLET | Freq: Three times a day (TID) | ORAL | Status: DC | PRN

## 2019-03-08 MED ORDER — MIDAZOLAM HCL 2 MG/2ML IJ SOLN
INTRAMUSCULAR | Status: AC
  Filled 2019-03-08: qty 2

## 2019-03-08 SURGICAL SUPPLY — 44 items
BLADE SURG SZ10 CARB STEEL (BLADE) ×3 IMPLANT
BNDG COHESIVE 4X5 TAN STRL (GAUZE/BANDAGES/DRESSINGS) IMPLANT
BNDG ESMARK 4X12 TAN STRL LF (GAUZE/BANDAGES/DRESSINGS) IMPLANT
CANISTER PREVENA 45 (CANNISTER) ×3 IMPLANT
CANISTER SUCT 1200ML W/VALVE (MISCELLANEOUS) ×3 IMPLANT
CHLORAPREP W/TINT 26 (MISCELLANEOUS) ×3 IMPLANT
COVER WAND RF STERILE (DRAPES) ×3 IMPLANT
CUFF TOURN SGL QUICK 18X4 (TOURNIQUET CUFF) ×3 IMPLANT
CUFF TOURN SGL QUICK 24 (TOURNIQUET CUFF)
CUFF TRNQT CYL 24X4X16.5-23 (TOURNIQUET CUFF) IMPLANT
DRAPE SURG 17X11 SM STRL (DRAPES) ×3 IMPLANT
ELECT REM PT RETURN 9FT ADLT (ELECTROSURGICAL)
ELECTRODE REM PT RTRN 9FT ADLT (ELECTROSURGICAL) IMPLANT
GLOVE BIO SURGEON STRL SZ8 (GLOVE) ×3 IMPLANT
GLOVE INDICATOR 8.0 STRL GRN (GLOVE) ×3 IMPLANT
GLOVE SURG ORTHO 8.5 STRL (GLOVE) ×3 IMPLANT
GOWN STRL REUS W/ TWL LRG LVL3 (GOWN DISPOSABLE) ×1 IMPLANT
GOWN STRL REUS W/ TWL XL LVL3 (GOWN DISPOSABLE) ×1 IMPLANT
GOWN STRL REUS W/TWL LRG LVL3 (GOWN DISPOSABLE) ×2
GOWN STRL REUS W/TWL XL LVL3 (GOWN DISPOSABLE) ×2
KIT PREVENA INCISION MGT 13 (CANNISTER) ×3 IMPLANT
KIT TURNOVER KIT A (KITS) ×3 IMPLANT
LABEL OR SOLS (LABEL) ×3 IMPLANT
NDL SAFETY ECLIPSE 18X1.5 (NEEDLE) ×1 IMPLANT
NEEDLE HYPO 18GX1.5 SHARP (NEEDLE) ×2
NS IRRIG 1000ML POUR BTL (IV SOLUTION) ×6 IMPLANT
PACK EXTREMITY ARMC (MISCELLANEOUS) ×3 IMPLANT
PAD CAST CTTN 4X4 STRL (SOFTGOODS) IMPLANT
PADDING CAST COTTON 4X4 STRL (SOFTGOODS)
SPLINT CAST 1 STEP 3X12 (MISCELLANEOUS) IMPLANT
SPONGE LAP 18X18 RF (DISPOSABLE) ×3 IMPLANT
STAPLER SKIN PROX 35W (STAPLE) IMPLANT
STOCKINETTE BIAS CUT 4 980044 (GAUZE/BANDAGES/DRESSINGS) IMPLANT
STOCKINETTE IMPERVIOUS 9X36 MD (GAUZE/BANDAGES/DRESSINGS) ×3 IMPLANT
SUT ETHILON 4-0 (SUTURE)
SUT ETHILON 4-0 FS2 18XMFL BLK (SUTURE)
SUT PROLENE 2 0 FS (SUTURE) ×3 IMPLANT
SUT VIC AB 2-0 CT1 36 (SUTURE) IMPLANT
SUT VIC AB 2-0 CT2 27 (SUTURE) ×3 IMPLANT
SUT VIC AB 4-0 SH 27 (SUTURE)
SUT VIC AB 4-0 SH 27XANBCTRL (SUTURE) IMPLANT
SUT VICRYL+ 3-0 36IN CT-1 (SUTURE) IMPLANT
SUTURE ETHLN 4-0 FS2 18XMF BLK (SUTURE) IMPLANT
SYR 10ML LL (SYRINGE) ×3 IMPLANT

## 2019-03-08 NOTE — Anesthesia Preprocedure Evaluation (Signed)
Anesthesia Evaluation  Patient identified by MRN, date of birth, ID band Patient awake    Reviewed: Allergy & Precautions, H&P , NPO status , reviewed documented beta blocker date and time   Airway Mallampati: II  TM Distance: >3 FB Neck ROM: full    Dental  (+) Chipped   Pulmonary Current Smoker and Patient abstained from smoking.,    Pulmonary exam normal        Cardiovascular Normal cardiovascular exam  Nml ECHO 2018   Neuro/Psych    GI/Hepatic GERD  Controlled,  Endo/Other    Renal/GU      Musculoskeletal   Abdominal   Peds  Hematology   Anesthesia Other Findings Past Medical History: No date: IV drug abuse ([place]) No date: Spontaneous pneumothorax Past Surgical History: 84/08/9627: APPLICATION OF WOUND VAC; Left     Comment:  Procedure: APPLICATION OF WOUND VAC;  Surgeon: Corky Mull, MD;  Location: [facility] ORS;  Service: Orthopedics;                Laterality: Left; No date: CHEST TUBE INSERTION 03/06/2019: I&D EXTREMITY; Left     Comment:  Procedure: IRRIGATION AND DEBRIDEMENT LEFT UPPER               FOREARM;  Surgeon: Corky Mull, MD;  Location: [facility]               ORS;  Service: Orthopedics;  Laterality: Left; BMI    Body Mass Index: 21.51 kg/m     Reproductive/Obstetrics                             Anesthesia Physical Anesthesia Plan  ASA: II  Anesthesia Plan: General   Post-op Pain Management:    Induction: Intravenous  PONV Risk Score and Plan: 2 and Ondansetron, Dexamethasone, Midazolam and Treatment may vary due to age or medical condition  Airway Management Planned: LMA  Additional Equipment:   Intra-op Plan:   Post-operative Plan: Extubation in OR  Informed Consent: I have reviewed the patients History and Physical, chart, labs and discussed the procedure including the risks, benefits and alternatives for the proposed anesthesia with the  patient or authorized representative who has indicated his/her understanding and acceptance.     Dental Advisory Given  Plan Discussed with: CRNA  Anesthesia Plan Comments:         Anesthesia Quick Evaluation

## 2019-03-08 NOTE — Op Note (Signed)
03/05/2019 - 03/08/2019  2:45 PM  Patient:   Leon Riley  Pre-Op Diagnosis:   Status post I&D of left forearm abscess  Post-Op Diagnosis:   Same  Procedure:   Irrigation and debridement with delayed primary closure of left forearm wound.  Surgeon:   Pascal Lux, MD  Assistant:   None  Anesthesia:   General LMA  Findings:   As above.  Complications:   None  Fluids:   500 cc crystalloid  EBL:   5 cc  UOP:   None  TT:   60 minutes at 250 mmHg  Drains:   Praveena x1  Closure:   2-0 Vicryl subcuticular sutures  Brief Clinical Note:   The patient is a 38 year old male who is now 2 days status post an irrigation and debridement of a volar left forearm abscess.  The patient has responded quite well clinically to the I&D and IV antibiotics as his examination shows significant reduction in the swelling, erythema, and tenderness he had prior to the I&D.  He presents at this time for a repeat I&D and possible delayed primary closure of the left forearm wound.  Procedure:   The patient was brought into the operating room and lain in the supine position.  After adequate general laryngeal mask anesthesia was obtained, the patient's left upper extremity was prepped with ChloraPrep solution before being draped sterilely.  Preoperative antibiotics were administered.  The limb was elevated for several minutes before the tourniquet was inflated to 250 mmHg.  The left forearm wound was reopened and digitally explored to release any potential adhesions.  No purulent material was identified.  The wound was copiously irrigated with 1 L of antibiotic irrigation and a second liter of sterile saline utilizing bulb irrigation.  Given how good the wound looked, it was elected to proceed with delayed primary closure.  This was accomplished using 2-0 Vicryl subcuticular sutures as concern was raised as to whether the patient was reliable enough come back for suture removal at a later date.  The central  portion of the wound was left open and a Praveena negative pressure system applied.  The patient was then awakened, extubated, and returned to the recovery room in satisfactory condition after tolerating the procedure well.

## 2019-03-08 NOTE — Transfer of Care (Signed)
Immediate Anesthesia Transfer of Care Note  Patient: Hilmer Aliberti  Procedure(s) Performed: IRRIGATION AND DEBRIDEMENT EXTREMITY (Left Arm Lower)  Patient Location: PACU  Anesthesia Type:General  Level of Consciousness: sedated  Airway & Oxygen Therapy: Patient Spontanous Breathing and Patient connected to face mask oxygen  Post-op Assessment: Report given to RN and Post -op Vital signs reviewed and stable  Post vital signs: Reviewed and stable  Last Vitals:  Vitals Value Taken Time  BP 123/76 03/08/19 1456  Temp 36.7 C 03/08/19 1456  Pulse 65 03/08/19 1458  Resp 19 03/08/19 1458  SpO2 99 % 03/08/19 1458  Vitals shown include unvalidated device data.  Last Pain:  Vitals:   03/08/19 1229  TempSrc: Temporal  PainSc: 4       Patients Stated Pain Goal: 0 (32/20/25 4270)  Complications: No apparent anesthesia complications

## 2019-03-08 NOTE — Progress Notes (Signed)
Attempted to reach MD x 2. No reply at this time

## 2019-03-08 NOTE — Anesthesia Post-op Follow-up Note (Signed)
Anesthesia QCDR form completed.        

## 2019-03-08 NOTE — Progress Notes (Signed)
PROGRESS NOTE    Leon Riley  BHA:[id number] DOB: 1981/02/15 DOA: 03/05/2019 PCP: Patient, No Pcp Per    Brief Narrative:  Mr. Cordoba is a 38 y.o. M with hx IVDU who presents with left wrist pain, swelling and carbuncles.  Patient evidently had had several days progressive swelling, redness and pain in the left arm/wrist without fever or chills.  Went to [facility] ER, diagnosed with cellulitis and possible compartment syndrome and was transferred to [facility] where he waited in the ER for 6 hours and left AMA, presented to [facility].  In our ER, WBC 11K, electrolytes and renal function normal.  Orthopedics were consulted.  There was no compartment syndrome but operative I&D of the abscesses was recommended.    Consultants:   Orthopedics  Procedures:   12/8 operative I&D of abscess  Antimicrobials:   Vancomycin 12/7 >>     Subjective:  Pt is sleeping , states needs to go home and he wants to go to OR so he can go home. No other complaints.  Overnight patient was going to leave [place] due to anxiety  Objective: Vitals:   03/07/19 1734 03/08/19 0037 03/08/19 0839 03/08/19 1229  BP: 121/70 107/64 109/81 137/82  Pulse: 78 76 63 71  Resp: 17 17 18 16   Temp: 98 F (36.7 C) 98.2 F (36.8 C) 98.4 F (36.9 C) 97.8 F (36.6 C)  TempSrc: Oral  Oral Temporal  SpO2: 100% 99% 99% 99%  Weight:    68 kg  Height:    5\' 10"  (1.778 m)    Intake/Output Summary (Last 24 hours) at 03/08/2019 1329 Last data filed at 03/07/2019 1830 Gross per 24 hour  Intake 120 ml  Output 250 ml  Net -130 ml   Filed Weights   03/05/19 1022 03/08/19 1229  Weight: 68 kg 68 kg    Examination:  General exam: Appears calm and comfortable, mildly anxious  respiratory system: CTA, no r/r/w Cardiovascular system: RRR, S1 & S2 heard, no m/r/g Gastrointestinal system: Abdomensoft, nt/nd +bs Central nervous system: Alert and oriented. No focal neurological deficits. Extremities: No  edema.  Left upper extremity wound VAC in place draining bloody fluid.  Left IV line in the left foot. No erythema Skin: Warm dry Psychiatry: Judgement and insight appear normal. Mood & affect appropriate.     Data Reviewed: I have personally reviewed following labs and imaging studies  CBC: Recent Labs  Lab 03/08/19 0911  WBC 6.8  NEUTROABS 3.7  HGB 10.9*  HCT 33.1*  MCV 75.7*  PLT 973   Basic Metabolic Panel: Recent Labs  Lab 03/05/19 1445 03/08/19 0911  NA  --  141  K  --  3.4*  CL  --  112*  CO2  --  19*  GLUCOSE  --  100*  BUN  --  16  CREATININE 0.96 0.94  CALCIUM  --  8.6*   GFR: Estimated Creatinine Clearance: 103.5 mL/min (by C-G formula based on SCr of 0.94 mg/dL). Liver Function Tests: No results for input(s): AST, ALT, ALKPHOS, BILITOT, PROT, ALBUMIN in the last 168 hours. No results for input(s): LIPASE, AMYLASE in the last 168 hours. No results for input(s): AMMONIA in the last 168 hours. Coagulation Profile: Recent Labs  Lab 03/05/19 1445  INR 1.0   Cardiac Enzymes: No results for input(s): CKTOTAL, CKMB, CKMBINDEX, TROPONINI in the last 168 hours. BNP (last 3 results) No results for input(s): PROBNP in the last 8760 hours. HbA1C: No results for input(s):  HGBA1C in the last 72 hours. CBG: Recent Labs  Lab 03/06/19 0748 03/07/19 0856 03/08/19 0838  GLUCAP 139* 124* 93   Lipid Profile: No results for input(s): CHOL, HDL, LDLCALC, TRIG, CHOLHDL, LDLDIRECT in the last 72 hours. Thyroid Function Tests: No results for input(s): TSH, T4TOTAL, FREET4, T3FREE, THYROIDAB in the last 72 hours. Anemia Panel: No results for input(s): VITAMINB12, FOLATE, FERRITIN, TIBC, IRON, RETICCTPCT in the last 72 hours. Sepsis Labs: No results for input(s): PROCALCITON, LATICACIDVEN in the last 168 hours.  Recent Results (from the past 240 hour(s))  SARS CORONAVIRUS 2 (TAT 6-24 HRS) Nasopharyngeal Nasopharyngeal Swab     Status: None   Collection Time:  03/05/19 12:45 PM   Specimen: Nasopharyngeal Swab  Result Value Ref Range Status   SARS Coronavirus 2 NEGATIVE NEGATIVE Final    Comment: (NOTE) SARS-CoV-2 target nucleic acids are NOT DETECTED. The SARS-CoV-2 RNA is generally detectable in upper and lower respiratory specimens during the acute phase of infection. Negative results do not preclude SARS-CoV-2 infection, do not rule out co-infections with other pathogens, and should not be used as the sole basis for treatment or other patient management decisions. Negative results must be combined with clinical observations, patient history, and epidemiological information. The expected result is Negative. Fact Sheet for Patients: HairSlick.nohttps://www.fda.gov/media/138098/download Fact Sheet for Healthcare Providers: quierodirigir.comhttps://www.fda.gov/media/138095/download This test is not yet approved or cleared by the [place] FDA and  has been authorized for detection and/or diagnosis of SARS-CoV-2 by FDA under an Emergency Use Authorization (EUA). This EUA will remain  in effect (meaning this test can be used) for the duration of the COVID-19 declaration under Section 56 4(b)(1) of the Act, 21 U.S.C. section 360bbb-3(b)(1), unless the authorization is terminated or revoked sooner. Performed at [facility], [address] [place], [place] [address]   CULTURE, BLOOD (ROUTINE X 2) w Reflex to ID Panel     Status: None (Preliminary result)   Collection Time: 03/05/19  2:04 PM   Specimen: BLOOD  Result Value Ref Range Status   Specimen Description BLOOD BLOOD RIGHT FOREARM  Final   Special Requests   Final    BOTTLES DRAWN AEROBIC AND ANAEROBIC Blood Culture adequate volume   Culture   Final    NO GROWTH 3 DAYS Performed at [facility], [address] [place], [place] [address]    Report Status PENDING  Incomplete  CULTURE, BLOOD (ROUTINE X 2) w Reflex to ID Panel     Status: None (Preliminary result)   Collection Time: 03/05/19   2:48 PM   Specimen: BLOOD  Result Value Ref Range Status   Specimen Description BLOOD RIGHT ANTECUBITAL  Final   Special Requests   Final    BOTTLES DRAWN AEROBIC AND ANAEROBIC Blood Culture adequate volume   Culture   Final    NO GROWTH 3 DAYS Performed at [facility], [address] [place], [place] [address]    Report Status PENDING  Incomplete  Aerobic/Anaerobic Culture (surgical/deep wound)     Status: None (Preliminary result)   Collection Time: 03/06/19  1:59 PM   Specimen: PATH Other; Tissue  Result Value Ref Range Status   Specimen Description   Final    ABSCESS Performed at [facility], [address] [place], [place] [address]    Special Requests   Final    LEFT UPPER FOREARM ABSCESS Performed at [facility], [address] [place], [place] [address]    Gram Stain  Final    FEW WBC PRESENT,BOTH PMN AND MONONUCLEAR FEW GRAM POSITIVE COCCI IN PAIRS RARE GRAM POSITIVE RODS    Culture   Final    RARE STAPHYLOCOCCUS AUREUS SUSCEPTIBILITIES TO FOLLOW CULTURE REINCUBATED FOR BETTER GROWTH Performed at [facility], [address] [place], [place] [address]    Report Status PENDING  Incomplete         Radiology Studies: No results found.      Scheduled Meds: . [MAR Hold] docusate sodium  100 mg Oral BID  . [MAR Hold] ibuprofen  800 mg Oral TID  . [MAR Hold] nicotine  21 mg Transdermal Daily   Continuous Infusions: . sodium chloride 100 mL/hr at 03/07/19 1513  . lactated ringers 100 mL/hr at 03/08/19 1246  . [MAR Hold] piperacillin-tazobactam (ZOSYN)  IV 3.375 g (03/08/19 9163)  . [MAR Hold] vancomycin 1,000 mg (03/08/19 0622)    Assessment & Plan:   Principal Problem:   Abscess of left arm Active Problems:   IV drug abuse (HCC)   Cellulitis of left arm   Tobacco abuse   IVDU related abscess of the forearm Afebrile -Continue vancomycin and zosyn -s/p wound vac, s/p  I&D of left forearm  abscess-03/06/19 -ortho-plan for OR today for repeat irrigation and debridement to help expedite healing process  IVDU and polysubstance abuse Counseling was done during this admission SW consulted Monitor for withdrawl    DVT prophylaxis: Lovenox Code Status: FULL Family Communication: none at bedside Disposition Plan: likely be here >2MN stays until medically stable.        LOS: 3 days   Time spent: 45 minutes with more than 50% COC    Lynn Ito, MD [facility] Pager [phone]  If 7PM-7AM, please contact night-coverage [email or web address] Password [email or web address] 03/08/2019, 1:29 PM Patient ID: Leon Riley, male   DOB: 09-01-1980, 38 y.o.   MRN: [id number]

## 2019-03-08 NOTE — Anesthesia Procedure Notes (Signed)
Procedure Name: LMA Insertion Date/Time: 03/08/2019 2:07 PM Performed by: Lia Foyer, CRNA Pre-anesthesia Checklist: Patient identified, Emergency Drugs available, Suction available, Patient being monitored and Timeout performed Patient Re-evaluated:Patient Re-evaluated prior to induction Oxygen Delivery Method: Circle system utilized Preoxygenation: Pre-oxygenation with 100% oxygen Induction Type: IV induction Ventilation: Mask ventilation without difficulty LMA: LMA inserted LMA Size: 4.0 Number of attempts: 1 Airway Equipment and Method: Patient positioned with wedge pillow

## 2019-03-09 NOTE — Anesthesia Postprocedure Evaluation (Signed)
Anesthesia Post Note  Patient: Leon Riley  Procedure(s) Performed: irrigation and debridement with closure left volar forearm with wound vac application (Left Arm Lower)  Patient location during evaluation: PACU Anesthesia Type: General Level of consciousness: awake and alert Pain management: pain level controlled Vital Signs Assessment: post-procedure vital signs reviewed and stable Respiratory status: spontaneous breathing, nonlabored ventilation, respiratory function stable and patient connected to nasal cannula oxygen Cardiovascular status: blood pressure returned to baseline and stable Postop Assessment: no apparent nausea or vomiting Anesthetic complications: no     Last Vitals:  Vitals:   03/08/19 1602 03/08/19 1657  BP: 140/89 137/90  Pulse: (!) 56 63  Resp: 11 18  Temp:  36.6 C  SpO2: 97% 98%    Last Pain:  Vitals:   03/08/19 1657  TempSrc: Oral  PainSc:                  Precious Haws Kiki Bivens

## 2019-03-10 LAB — CULTURE, BLOOD (ROUTINE X 2)
Culture: NO GROWTH
Culture: NO GROWTH
Special Requests: ADEQUATE
Special Requests: ADEQUATE

## 2019-03-11 LAB — AEROBIC/ANAEROBIC CULTURE W GRAM STAIN (SURGICAL/DEEP WOUND)

## 2019-03-16 ENCOUNTER — Emergency Department
Admission: EM | Admit: 2019-03-16 | Discharge: 2019-03-17 | Disposition: A | Discharge status: Home / Self Care | Payer: Self-pay | Attending: Emergency Medicine | Admitting: Emergency Medicine

## 2019-03-16 DIAGNOSIS — Z4800 Encounter for change or removal of nonsurgical wound dressing: Secondary | ICD-10-CM | POA: Insufficient documentation

## 2019-03-16 DIAGNOSIS — Z5321 Procedure and treatment not carried out due to patient leaving prior to being seen by health care provider: Secondary | ICD-10-CM | POA: Insufficient documentation

## 2019-03-21 NOTE — Discharge Summary (Signed)
Eldin Bonsell BOF:[id number] DOB: [birth year] DOA: 03/05/2019  PCP: Patient, No Pcp Per  Admit date: 03/05/2019 Discharge date: 03/21/2019  Admitted From: home Disposition:  SIGNED OUT AMA  Recommendations for Outpatient Follow-up FOLLOW UP WITH ORTHO SIGNED OUT AMA    Brief/Interim Summary: Mr. Unrein a 38 y.o.Mwith hx IVDU who presents with left wrist pain, swelling and carbuncles. Patient evidently had had several days progressive swelling, redness and pain in the left arm/wrist without fever or chills. Went to [facility] ER, diagnosed with cellulitis and possible compartment syndrome and was transferred to [facility] where he waited in the ER for 6 hours and left AMA, presented to [facility]. In our ER, WBC 11K, electrolytes and renal function normal. Orthopedics were consulted. There was no compartment syndrome but operative I&D of the abscesses was recommended.  He had I&D, no growth on blood cultures.  He was started on vancomycin and Zosyn.  Status post wound VAC placement.  Taken to the OR again for repeat irrigation and debridement and that evening which I did not see the patient patient had signed out AMA.  IVDU related abscess of the forearm No improvement yet. Blood cultures no growth. Afebrile Labs pending -Continuevancomycin and zosyn -s/p wound vac, s/p I&D of left forearm abscess-03/06/19 -ortho following-plan for OR tomorrow for repeat irrigation and debridement to help expedite healing process   Discharge Diagnoses:  Principal Problem:   Abscess of left arm Active Problems:   IV drug abuse (HCC)   Cellulitis of left arm   Tobacco abuse    Discharge Instructions   Allergies as of 03/08/2019   No Known Allergies     Medication List    TAKE these medications   sulfamethoxazole-trimethoprim 800-160 MG tablet Commonly known as: BACTRIM DS Take 1 tablet by mouth 2 (two) times daily.     ASK your doctor about these medications   Naloxone HCl 0.4  MG/0.4ML Soaj Inject 0.4 mg as directed once as needed (heroin overdose). Inject 1 mL intramuscularly upon signs of opioid overdose. May repeat X 1. Call 911.      Follow-up Information    Anson Oregon, PA-C On 03/12/2019.   Specialty: Physician Assistant Why: at 1115 Contact information: [address] [place] [phone]          No Known Allergies  Consultations:  Orthopedics   Procedures/Studies: CT FOREARM LEFT W CONTRAST  Result Date: 03/05/2019 CLINICAL DATA:  Edema in the left upper extremity. History of IV drug abuse. Suspect DVT or abscess. EXAM: CT OF THE UPPER LEFT EXTREMITY WITH CONTRAST TECHNIQUE: Multidetector CT imaging of the left forearm was performed according to the standard protocol following intravenous contrast administration. CONTRAST:  OMNIPAQUE IOHEXOL 300 MG/ML  SOLN COMPARISON:  None recent. Radiographs 11/20/2015. FINDINGS: Bones/Joint/Cartilage No evidence of acute fracture, dislocation or bone destruction. The joint spaces appear maintained at the left elbow and left wrist. No significant joint effusions. Ligaments Suboptimally assessed by CT. Muscles and Tendons There is a large peripherally enhancing intramuscular fluid collection in the radial aspect of the distal forearm, involving the flexor musculature. This measures up to 5.5 x 3.1 x 2.6 cm and is consistent with an abscess. No evidence of associated foreign body or soft tissue emphysema. No significant tenosynovitis. No other focal fluid collections are identified. Soft tissues There is diffuse subcutaneous edema throughout the forearm, extending into the dorsal aspect of the hand. No definite deep or superficial venous thrombosis identified. There are prominent lymph  nodes in the antecubital fossa, likely reactive. IMPRESSION: 1. Large intramuscular abscess in the distal forearm, involving the flexor musculature. 2. No evidence of associated  foreign body or soft tissue emphysema. 3. No evidence of osteomyelitis or septic joint. 4. Prominent lymph nodes in the antecubital fossa, likely reactive. Electronically Signed   By: Richardean Sale M.D.   On: 03/05/2019 12:10         Discharge Exam: Vitals:   03/08/19 1602 03/08/19 1657  BP: 140/89 137/90  Pulse: (!) 56 63  Resp: 11 18  Temp:  97.8 F (36.6 C)  SpO2: 97% 98%   Vitals:   03/08/19 1541 03/08/19 1556 03/08/19 1602 03/08/19 1657  BP: (!) 133/93 129/89 140/89 137/90  Pulse: 67 (!) 57 (!) 56 63  Resp: 15 19 11 18   Temp:  98.3 F (36.8 C)  97.8 F (36.6 C)  TempSrc:    Oral  SpO2: 99% 96% 97% 98%  Weight:      Height:       General exam: Appears calm and comfortable, mildly anxious  respiratory system: Clear to auscultation. Respiratory effort normal. Cardiovascular system: S1 & S2 heard, RRR. No JVD, murmurs, rubs, gallops or clicks.  Gastrointestinal system: Abdomen is nondistended, soft and nontender.  Normal bowel sounds heard. Central nervous system: Alert and oriented. No focal neurological deficits. Extremities: No edema.  Left upper extremity wound VAC in place draining bloody fluid.  Left IV line in the left foot Skin: Warm dry Psychiatry: Judgement and insight appear normal. Mood & affect appropriate.     The results of significant diagnostics from this hospitalization (including imaging, microbiology, ancillary and laboratory) are listed below for reference.     Microbiology: No results found for this or any previous visit (from the past 240 hour(s)).   Labs: BNP (last 3 results) No results for input(s): BNP in the last 8760 hours. Basic Metabolic Panel: No results for input(s): NA, K, CL, CO2, GLUCOSE, BUN, CREATININE, CALCIUM, MG, PHOS in the last 168 hours. Liver Function Tests: No results for input(s): AST, ALT, ALKPHOS, BILITOT, PROT, ALBUMIN in the last 168 hours. No results for input(s): LIPASE, AMYLASE in the last 168 hours. No  results for input(s): AMMONIA in the last 168 hours. CBC: No results for input(s): WBC, NEUTROABS, HGB, HCT, MCV, PLT in the last 168 hours. Cardiac Enzymes: No results for input(s): CKTOTAL, CKMB, CKMBINDEX, TROPONINI in the last 168 hours. BNP: Invalid input(s): POCBNP CBG: No results for input(s): GLUCAP in the last 168 hours. D-Dimer No results for input(s): DDIMER in the last 72 hours. Hgb A1c No results for input(s): HGBA1C in the last 72 hours. Lipid Profile No results for input(s): CHOL, HDL, LDLCALC, TRIG, CHOLHDL, LDLDIRECT in the last 72 hours. Thyroid function studies No results for input(s): TSH, T4TOTAL, T3FREE, THYROIDAB in the last 72 hours.  Invalid input(s): FREET3 Anemia work up No results for input(s): VITAMINB12, FOLATE, FERRITIN, TIBC, IRON, RETICCTPCT in the last 72 hours. Urinalysis No results found for: COLORURINE, APPEARANCEUR, LABSPEC, [place], GLUCOSEU, HGBUR, BILIRUBINUR, KETONESUR, PROTEINUR, UROBILINOGEN, NITRITE, LEUKOCYTESUR Sepsis Labs Invalid input(s): PROCALCITONIN,  WBC,  LACTICIDVEN Microbiology No results found for this or any previous visit (from the past 240 hour(s)).   Time coordinating discharge: Over 30 minutes  SIGNED:   Nolberto Hanlon, MD  [facility] 03/21/2019, 5:21 PM Pager   If 7PM-7AM, please contact night-coverage [email or web address] Password [email or web address]

## 2019-09-22 IMAGING — DX DG HAND COMPLETE 3+V*R*
3 series · 3 of 3 positions shown · non-contrast
Comparison: Plain films right ring finger 07/29/2006.

CLINICAL DATA: Right hand pain and swelling since 12/31/2016.

EXAM:
RIGHT HAND - COMPLETE 3+ VIEW

[hand ap]
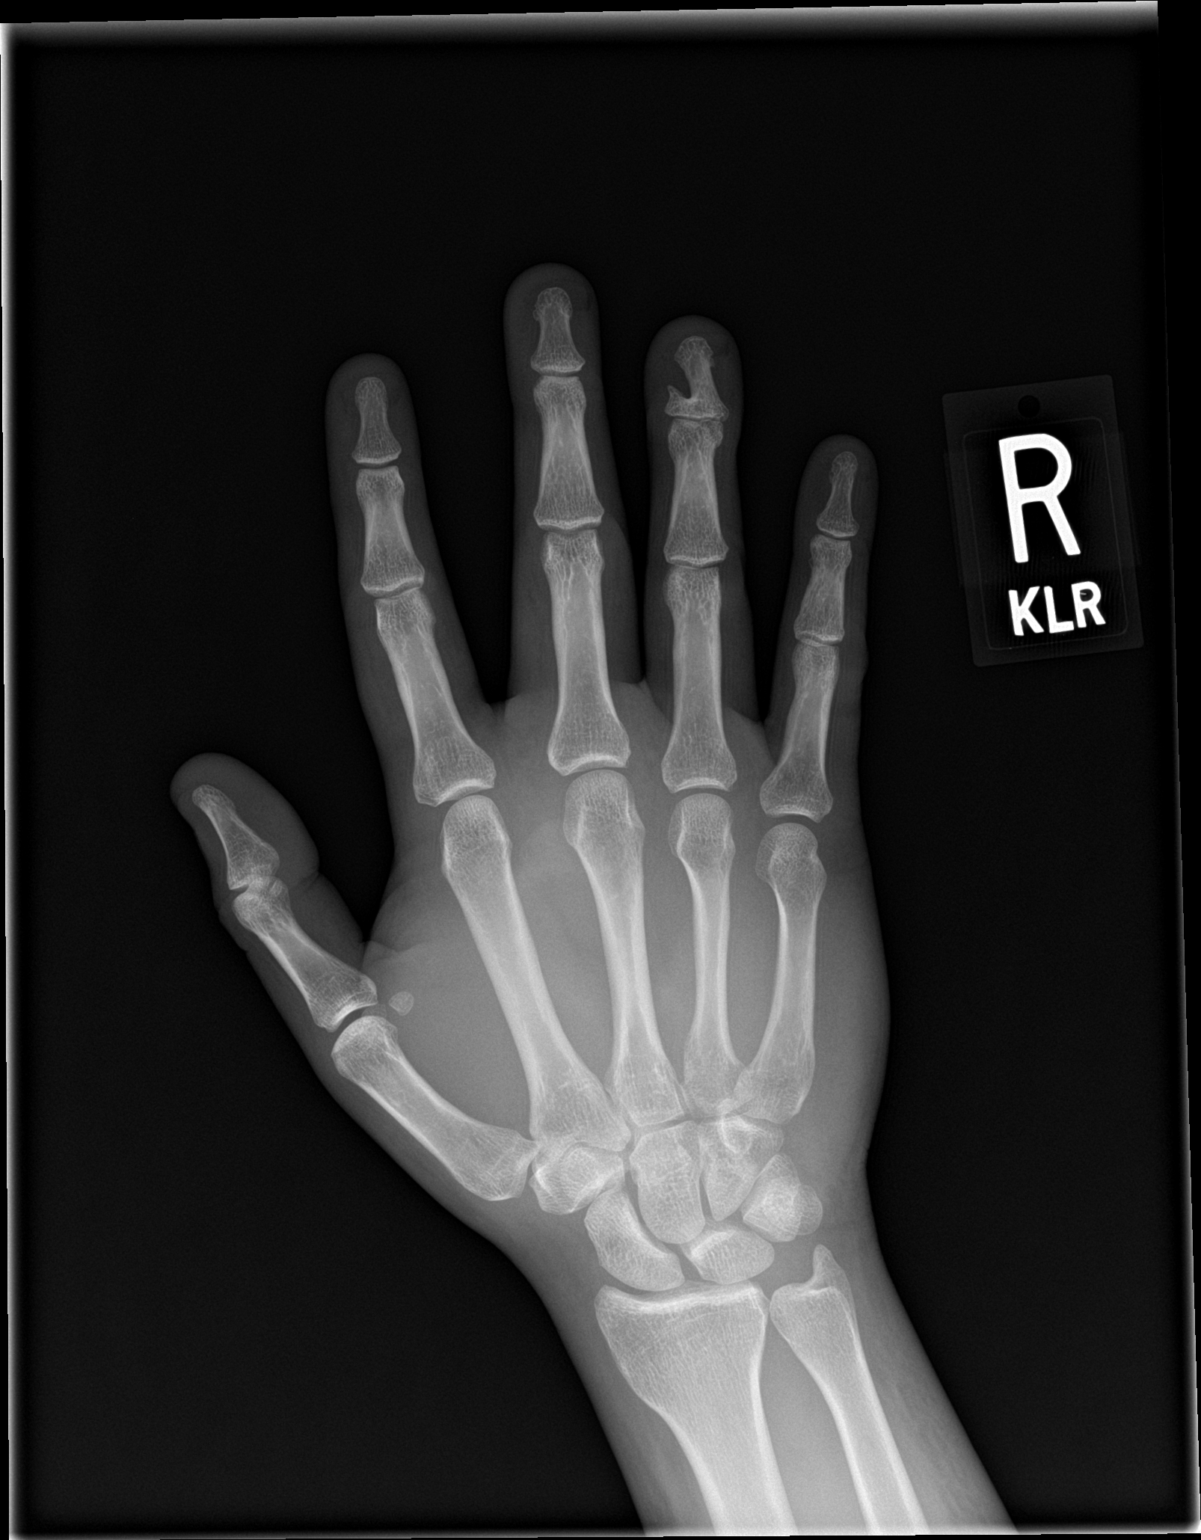

[hand obl]
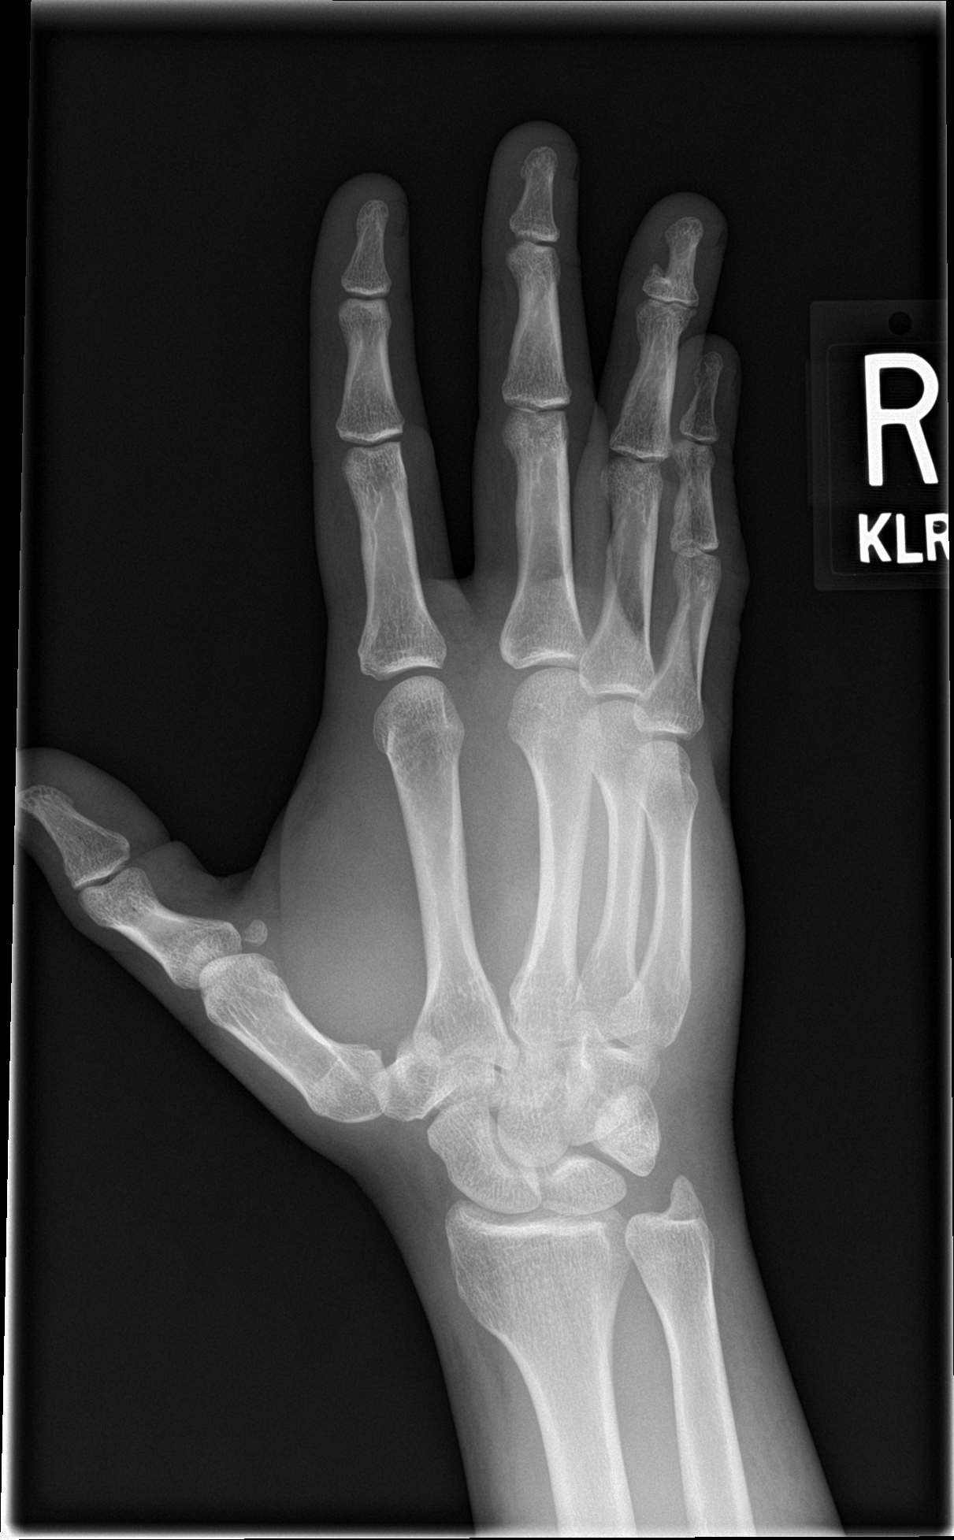

[hand lat]
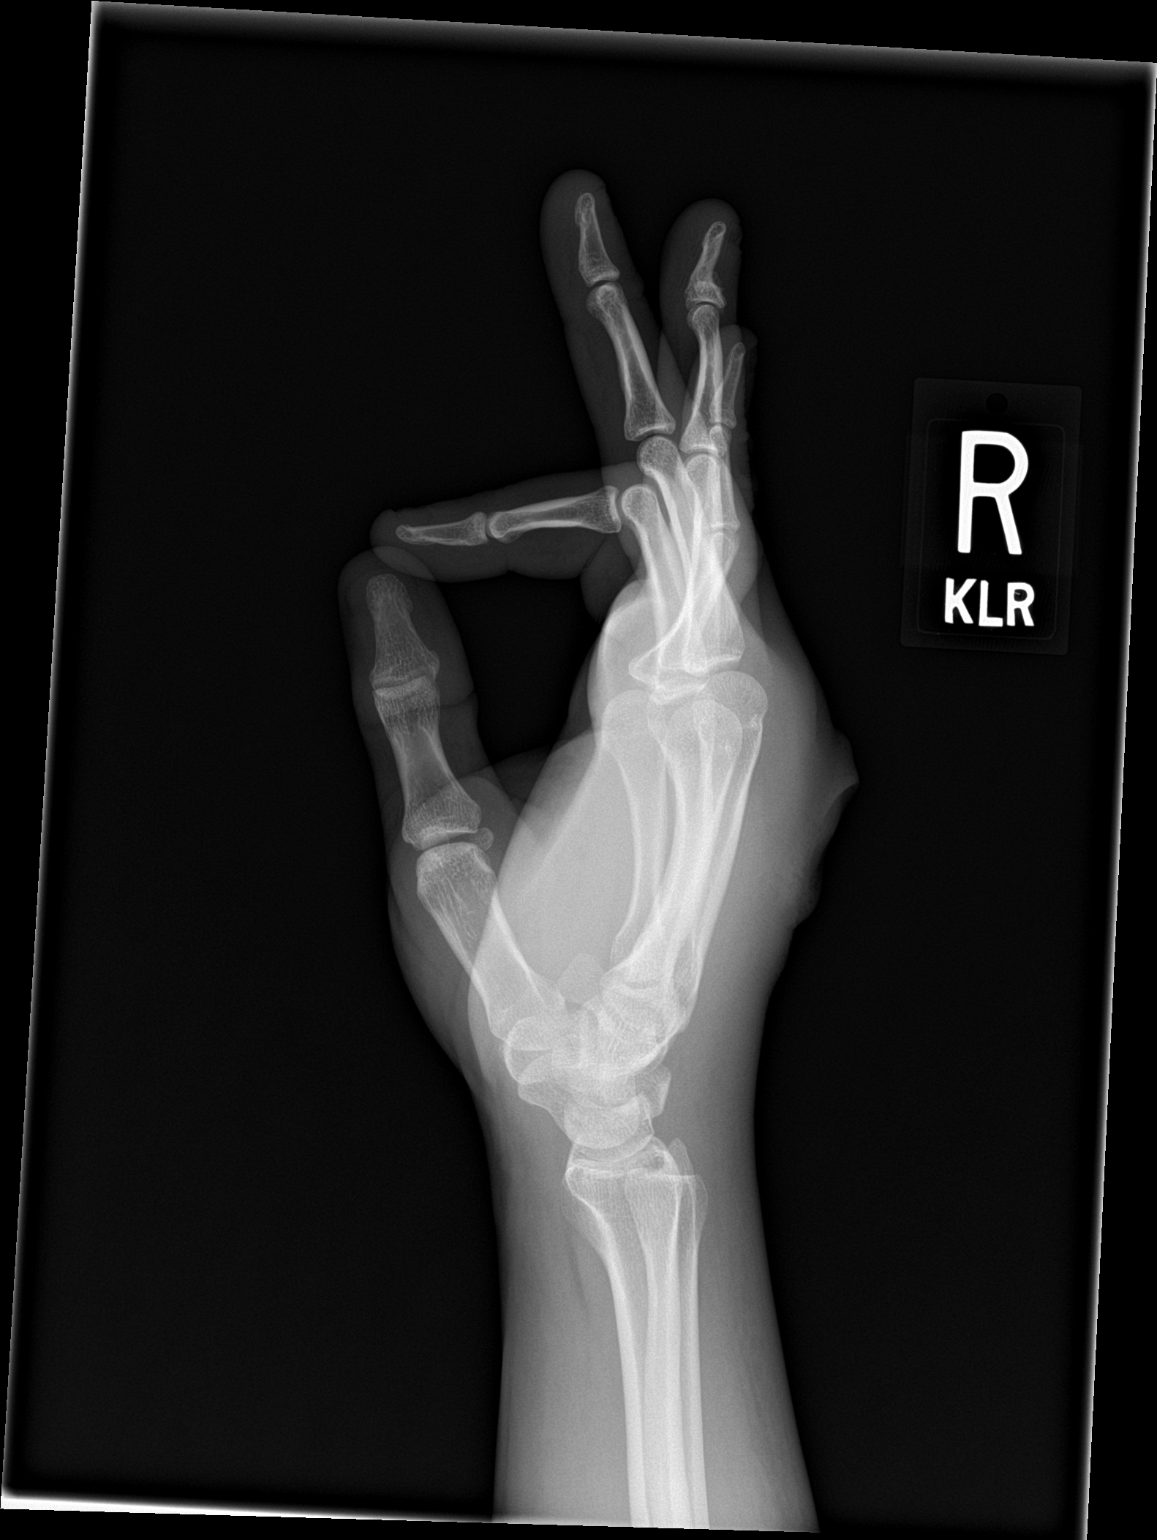

[3 of 3 positions shown; findings below may reference images not displayed]

FINDINGS: Soft tissues of the hand are markedly and diffusely swollen. No
radiopaque foreign body or soft tissue gas is identified. There is
no acute bony abnormality. Remote healed fracture of the distal
phalanx of the right ring finger is noted.
IMPRESSION: Marked soft tissue swelling of the right hand without underlying
foreign body or acute bony abnormality.

Remote healed fracture distal phalanx right ring finger.
